# Patient Record
Sex: Male | Born: 1990 | Race: Black or African American | Hispanic: No | Marital: Married
Health system: Southern US, Community
[De-identification: ages and names within clinical notes are randomized; demographics above are authoritative.]

## PROBLEM LIST (undated history)

## (undated) DIAGNOSIS — I10 Essential (primary) hypertension: Secondary | ICD-10-CM

## (undated) DIAGNOSIS — G4730 Sleep apnea, unspecified: Secondary | ICD-10-CM

## (undated) DIAGNOSIS — J40 Bronchitis, not specified as acute or chronic: Secondary | ICD-10-CM

## (undated) DIAGNOSIS — R42 Dizziness and giddiness: Secondary | ICD-10-CM

## (undated) DIAGNOSIS — I509 Heart failure, unspecified: Secondary | ICD-10-CM

## (undated) HISTORY — PX: ABCESS DRAINAGE: SHX399

## (undated) HISTORY — PX: KNEE SURGERY: SHX244

## (undated) HISTORY — DX: Dizziness and giddiness: R42

## (undated) HISTORY — PX: TONSILLECTOMY: SUR1361

---
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
## 2012-04-25 ENCOUNTER — Emergency Department (HOSPITAL_COMMUNITY): Payer: Self-pay

## 2012-04-25 ENCOUNTER — Encounter (HOSPITAL_COMMUNITY): Payer: Self-pay | Admitting: Emergency Medicine

## 2012-04-25 ENCOUNTER — Emergency Department (HOSPITAL_COMMUNITY)
Admission: EM | Admit: 2012-04-25 | Discharge: 2012-04-25 | Disposition: A | Discharge status: Home / Self Care | Payer: Self-pay | Attending: Emergency Medicine | Admitting: Emergency Medicine

## 2012-04-25 DIAGNOSIS — M12819 Other specific arthropathies, not elsewhere classified, unspecified shoulder: Secondary | ICD-10-CM

## 2012-04-25 DIAGNOSIS — Z87891 Personal history of nicotine dependence: Secondary | ICD-10-CM | POA: Insufficient documentation

## 2012-04-25 DIAGNOSIS — X500XXA Overexertion from strenuous movement or load, initial encounter: Secondary | ICD-10-CM | POA: Insufficient documentation

## 2012-04-25 DIAGNOSIS — Z880 Allergy status to penicillin: Secondary | ICD-10-CM | POA: Insufficient documentation

## 2012-04-25 DIAGNOSIS — S43429A Sprain of unspecified rotator cuff capsule, initial encounter: Secondary | ICD-10-CM | POA: Insufficient documentation

## 2012-04-25 MED ORDER — KETOROLAC TROMETHAMINE 60 MG/2ML IM SOLN: 60.0000 mg | Freq: Once | INTRAMUSCULAR | Status: DC

## 2012-04-25 MED ORDER — TRAMADOL HCL 50 MG PO TABS: 50.0000 mg | ORAL_TABLET | Freq: Four times a day (QID) | ORAL | Status: AC | PRN

## 2012-04-25 NOTE — ED Notes (Signed)
Pt verbalizes understanding 

## 2012-04-25 NOTE — ED Notes (Signed)
Ortho at bedside.

## 2012-04-25 NOTE — ED Provider Notes (Signed)
History     CSN: [id number]  Arrival date & time 04/25/12  1508   First MD Initiated Contact with Patient 04/25/12 1531      Chief Complaint  Patient presents with  . Shoulder Pain    (Consider location/radiation/quality/duration/timing/severity/associated sxs/prior treatment) HPI   21 y.o. male in no acute distress complaining of exacerbation to left shoulder pain after reaching out left arm earlier today. Patient has history of rotator cuff tear occurring in March of 2013. He did not followup with the orthopedist for it. He has repetitive stress to both arms at his work. He is right-hand dominant. He denies any weakness, numbness or paresthesia. Range of motion is slightly reduced. Pain is severe at 8/10, located on the superior aspect of the left shoulder, exacerbated by movement and radiates to the neck.    History reviewed. No pertinent past medical history.  History reviewed. No pertinent past surgical history.  No family history on file.  History  Substance Use Topics  . Smoking status: Former Games developer  . Smokeless tobacco: Not on file  . Alcohol Use: No      Review of Systems  Musculoskeletal: Positive for arthralgias.  All other systems reviewed and are negative.    Allergies  Amoxicillin  Home Medications   Current Outpatient Rx  Name Route Sig Dispense Refill  . PROMETHAZINE HCL 6.25 MG/5ML PO SYRP Oral Take 6.25 mg by mouth 4 (four) times daily as needed. For nausea.    Marland Kitchen PRESCRIPTION MEDICATION Oral Take 1 capsule by mouth 2 (two) times daily. Antibiotic for bronchitis.      BP 135/64  Pulse 72  Temp 98.2 F (36.8 C) (Oral)  Resp 16  SpO2 98%  Physical Exam  Nursing note and vitals reviewed. Constitutional: He is oriented to person, place, and time. He appears well-developed and well-nourished. No distress.  HENT:  Head: Normocephalic.  Eyes: Conjunctivae and EOM are normal.  Neck: Normal range of motion. Neck supple.  Cardiovascular:  Normal rate.   Pulmonary/Chest: Effort normal.  Abdominal: Soft.  Musculoskeletal: Normal range of motion.        No deformities erythema or swelling noted to right shoulder. Patient has mildly reduced range of motion in abduction however drop arm test is negative. Patient's radial pulses are 2+ bilaterally and he also has normal sensation to soft touch and pinprick.  Neurological: He is alert and oriented to person, place, and time.  Psychiatric: He has a normal mood and affect.    ED Course  Procedures (including critical care time)  Labs Reviewed - No data to display No results found.   1. Rotator cuff arthropathy       MDM  21 year old male presenting with exacerbation of prior rotator cuff injury. There is no transection to the rotator cuff is drop arm is negative and he is able to lift the arm over 90. Advised patient that it is important to follow with orthopedist for possible physical therapy so this does not become a chronic issue. Patient voices understanding Patient is driving which limits pain medication options at the moment.  Pt verbalized understanding and agrees with care plan. Outpatient follow-up and return precautions given.           Wynetta Emery, PA-C 04/25/12 1626

## 2012-04-25 NOTE — ED Notes (Signed)
Pt presenting to ed with c/o left shoulder pain pt states he tore his rotator cuff in march/april and he didn't go to rehab and today he reached up and now he's having pain

## 2012-04-25 NOTE — ED Provider Notes (Signed)
Medical screening examination/treatment/procedure(s) were performed by non-physician practitioner and as supervising physician I was immediately available for consultation/collaboration.  Izekiel Flegel T Adenike Shidler, MD 04/25/12 2318 

## 2012-04-25 NOTE — Progress Notes (Signed)
Orthopedic Tech Progress Note Patient Details:  Dale Evans 1991/05/29 [id number]  Ortho Devices Type of Ortho Device: Arm foam sling Ortho Device/Splint Location: left arm Ortho Device/Splint Interventions: Application   Nikki Dom 04/25/2012, 4:50 PM

## 2012-05-27 ENCOUNTER — Emergency Department (HOSPITAL_COMMUNITY)
Admission: EM | Admit: 2012-05-27 | Discharge: 2012-05-27 | Disposition: A | Discharge status: Home / Self Care | Payer: 59 | Attending: Emergency Medicine | Admitting: Emergency Medicine

## 2012-05-27 ENCOUNTER — Emergency Department (HOSPITAL_COMMUNITY): Payer: 59

## 2012-05-27 ENCOUNTER — Encounter (HOSPITAL_COMMUNITY): Payer: Self-pay | Admitting: *Deleted

## 2012-05-27 DIAGNOSIS — Y9361 Activity, american tackle football: Secondary | ICD-10-CM | POA: Insufficient documentation

## 2012-05-27 DIAGNOSIS — X500XXA Overexertion from strenuous movement or load, initial encounter: Secondary | ICD-10-CM | POA: Insufficient documentation

## 2012-05-27 DIAGNOSIS — Y9239 Other specified sports and athletic area as the place of occurrence of the external cause: Secondary | ICD-10-CM | POA: Insufficient documentation

## 2012-05-27 DIAGNOSIS — S8391XA Sprain of unspecified site of right knee, initial encounter: Secondary | ICD-10-CM

## 2012-05-27 DIAGNOSIS — IMO0002 Reserved for concepts with insufficient information to code with codable children: Secondary | ICD-10-CM | POA: Insufficient documentation

## 2012-05-27 DIAGNOSIS — M79609 Pain in unspecified limb: Secondary | ICD-10-CM | POA: Insufficient documentation

## 2012-05-27 MED ORDER — NAPROXEN 500 MG PO TABS: 500.0000 mg | ORAL_TABLET | Freq: Two times a day (BID) | ORAL | Status: DC

## 2012-05-27 MED ORDER — HYDROCODONE-ACETAMINOPHEN 5-500 MG PO TABS: 1.0000 | ORAL_TABLET | Freq: Four times a day (QID) | ORAL | Status: DC | PRN

## 2012-05-27 MED ORDER — IBUPROFEN 800 MG PO TABS
800.0000 mg | ORAL_TABLET | Freq: Once | ORAL | Status: AC
  Administered 2012-05-27: 800 mg via ORAL
  Filled 2012-05-27: qty 1

## 2012-05-27 NOTE — ED Notes (Addendum)
Pt was playing football when injured kneed. Pt reports planting right foot, knee bending inward. Pt has hx of knee surgeries with same knee. Pt is unable to bear weight, has limited range of motion. Pt also c/o of right thumb pain from bracing fall

## 2012-05-27 NOTE — ED Notes (Signed)
Ortho tech at bedside 

## 2012-05-27 NOTE — ED Provider Notes (Signed)
History     CSN: [id number]  Arrival date & time 05/27/12  2023   First MD Initiated Contact with Patient 05/27/12 2230      Chief Complaint  Patient presents with  . Knee Injury    (Consider location/radiation/quality/duration/timing/severity/associated sxs/prior treatment) HPI Comments: Dale Evans is a 21 y.o. Male who presents with complaint of a right knee injury and right thumb injury. Pt states he was playing football when he planted his foot wrong and his knee hyperextended. Pt states it made him fall to the ground where he must have put his hand wrong and injured his right thumb. Pt states knee and thumb pain worsened with movement. He is unable to bear weight on right leg. Pt denies any other injuries. Hx of partial traumatic patella tendon tear to that leg surgically repaired. NO medications taken prior to the arrival.   History reviewed. No pertinent past medical history.  Past Surgical History  Procedure Date  . Knee surgery     History reviewed. No pertinent family history.  History  Substance Use Topics  . Smoking status: Former Games developer  . Smokeless tobacco: Never Used  . Alcohol Use: No      Review of Systems  Constitutional: Negative for chills.  Respiratory: Negative.   Cardiovascular: Negative.   Musculoskeletal: Positive for joint swelling and arthralgias.  Neurological: Negative for dizziness, weakness, numbness and headaches.    Allergies  Licorice flavor and Amoxicillin  Home Medications   Current Outpatient Rx  Name Route Sig Dispense Refill  . PRESCRIPTION MEDICATION Oral Take 1 capsule by mouth 2 (two) times daily. Antibiotic for bronchitis.    Marland Kitchen PROMETHAZINE HCL 6.25 MG/5ML PO SYRP Oral Take 6.25 mg by mouth 4 (four) times daily as needed. For nausea.      BP 128/53  Pulse 81  Temp 98.4 F (36.9 C) (Oral)  Resp 20  Ht 6\' 2"  (1.88 m)  Wt 285 lb (129.275 kg)  BMI 36.59 kg/m2  SpO2 97%  Physical Exam  Nursing note and vitals  reviewed. Constitutional: He is oriented to person, place, and time. He appears well-developed and well-nourished. No distress.  HENT:  Head: Normocephalic and atraumatic.  Eyes: Conjunctivae normal are normal.  Cardiovascular: Normal rate, regular rhythm and normal heart sounds.   Pulmonary/Chest: Effort normal and breath sounds normal. No respiratory distress. He has no wheezes. He has no rales.  Musculoskeletal: He exhibits edema and tenderness.       Right knee appears swollen. Tender over medial joint. Pain with any ROM. Joint is stable with negative anterior, posterior drawer signs. Pain but no laxity with medial and lateral stress. Patella intact. Right thumb swelling noted at the mcp joint. Tender to palpation over ulnar aspect. Pain with any rom. Dorsal pedal pulses normal.  Neurological: He is alert and oriented to person, place, and time.  Skin: Skin is warm and dry.  Psychiatric: He has a normal mood and affect.    ED Course  Procedures (including critical care time)  Labs Reviewed - No data to display Dg Knee Complete 4 Views Right  05/27/2012  *RADIOLOGY REPORT*  Clinical Data: Knee injury with pain  RIGHT KNEE - COMPLETE 4+ VIEW  Comparison: 01/24/2006  Findings: Negative for fracture.  Joint spaces are normal.  There is a joint effusion.  IMPRESSION: Joint effusion.  Negative for fracture.   Original Report Authenticated By: Camelia Phenes, M.D.    Dg Finger Thumb Right  05/27/2012  *RADIOLOGY  REPORT*  Clinical Data: Injury, pain.  RIGHT THUMB 2+V  Comparison: None.  Plain films 01/23/2007.  Findings: There is subtle cortical irregularity along the ulnar aspect of the base of the proximal phalanx of the thumb which could be due to nondisplaced fracture at the attachment site of the ulnar collateral ligament.  Imaged bones otherwise appear normal.  No dislocation.  IMPRESSION: Findings worrisome for nondisplaced avulsion fracture at the ulnar collateral ligament attachment site  to the proximal phalanx of the thumb.   Original Report Authenticated By: Bernadene Bell. D'ALESSIO, M.D.     PT's knee is swollen, but stable. X-ray negative. Patella intact. Neurovascularly intact. Do not think there is any vascular injury. Will place in an immobilizer, crutches, thumb spika, follow up with orthopedics.    1. Right knee sprain       MDM          Lottie Mussel, PA 05/28/12 [phone]

## 2012-05-29 NOTE — ED Provider Notes (Signed)
Medical screening examination/treatment/procedure(s) were performed by non-physician practitioner and as supervising physician I was immediately available for consultation/collaboration.  Marico Buckle, MD 05/29/12 2234 

## 2015-01-16 ENCOUNTER — Emergency Department (HOSPITAL_COMMUNITY)
Admission: EM | Admit: 2015-01-16 | Discharge: 2015-01-17 | Disposition: A | Discharge status: Home / Self Care | Payer: 59 | Attending: Emergency Medicine | Admitting: Emergency Medicine

## 2015-01-16 DIAGNOSIS — S6992XA Unspecified injury of left wrist, hand and finger(s), initial encounter: Secondary | ICD-10-CM | POA: Insufficient documentation

## 2015-01-16 DIAGNOSIS — Z87891 Personal history of nicotine dependence: Secondary | ICD-10-CM | POA: Insufficient documentation

## 2015-01-16 DIAGNOSIS — Y929 Unspecified place or not applicable: Secondary | ICD-10-CM | POA: Insufficient documentation

## 2015-01-16 DIAGNOSIS — X58XXXA Exposure to other specified factors, initial encounter: Secondary | ICD-10-CM | POA: Insufficient documentation

## 2015-01-16 DIAGNOSIS — Y998 Other external cause status: Secondary | ICD-10-CM | POA: Insufficient documentation

## 2015-01-16 DIAGNOSIS — Z791 Long term (current) use of non-steroidal anti-inflammatories (NSAID): Secondary | ICD-10-CM | POA: Insufficient documentation

## 2015-01-16 DIAGNOSIS — M79645 Pain in left finger(s): Secondary | ICD-10-CM

## 2015-01-16 DIAGNOSIS — Y9372 Activity, wrestling: Secondary | ICD-10-CM | POA: Insufficient documentation

## 2015-01-16 DIAGNOSIS — Z880 Allergy status to penicillin: Secondary | ICD-10-CM | POA: Insufficient documentation

## 2015-01-17 ENCOUNTER — Emergency Department (HOSPITAL_COMMUNITY): Payer: 59

## 2015-01-17 ENCOUNTER — Encounter (HOSPITAL_COMMUNITY): Payer: Self-pay | Admitting: Emergency Medicine

## 2015-01-17 MED ORDER — NAPROXEN 500 MG PO TABS
500.0000 mg | ORAL_TABLET | Freq: Once | ORAL | Status: AC
  Administered 2015-01-17: 500 mg via ORAL
  Filled 2015-01-17: qty 1

## 2015-01-17 MED ORDER — HYDROCODONE-ACETAMINOPHEN 5-325 MG PO TABS
1.0000 | ORAL_TABLET | Freq: Once | ORAL | Status: AC
  Administered 2015-01-17: 1 via ORAL
  Filled 2015-01-17: qty 1

## 2015-01-17 MED ORDER — HYDROCODONE-ACETAMINOPHEN 5-325 MG PO TABS: 1.0000 | ORAL_TABLET | ORAL | Status: DC | PRN

## 2015-01-17 MED ORDER — NAPROXEN 500 MG PO TABS: 500.0000 mg | ORAL_TABLET | Freq: Two times a day (BID) | ORAL | Status: DC

## 2015-01-17 NOTE — ED Notes (Signed)
Pt from home c/o left thumb pain with movement and swelling. Pt reports several days ago he was wrestling and injured his thumb but was then okay now with more pain and swelling.

## 2015-01-17 NOTE — ED Provider Notes (Signed)
CSN: [id number]     Arrival date & time 01/16/15  2323 History   First MD Initiated Contact with Patient 01/17/15 [phone]     Chief Complaint  Patient presents with  . thumb injury    (Consider location/radiation/quality/duration/timing/severity/associated sxs/prior Treatment) HPI  Dale Evans is a 24 year old male presenting with pain in his left thumb and wrist. He states he noticed the pain today around noon when he was lifting heavy boxes at work. He states the pain has become progressively worse throughout the day. He currently rates the pain as 7 out of 10. He denies any numbness, tingling, weakness in his hand.  History reviewed. No pertinent past medical history. Past Surgical History  Procedure Laterality Date  . Knee surgery     No family history on file. History  Substance Use Topics  . Smoking status: Former Games developermoker  . Smokeless tobacco: Never Used  . Alcohol Use: No    Review of Systems  Musculoskeletal: Positive for arthralgias. Negative for joint swelling.  Neurological: Negative for numbness.      Allergies  Licorice flavor and Amoxicillin  Home Medications   Prior to Admission medications   Medication Sig Start Date End Date Taking? Authorizing Provider  HYDROcodone-acetaminophen (VICODIN) 5-500 MG per tablet Take 1-2 tablets by mouth every 6 (six) hours as needed for pain. 05/27/12   Tatyana Kirichenko, PA-C  naproxen (NAPROSYN) 500 MG tablet Take 1 tablet (500 mg total) by mouth 2 (two) times daily. 05/27/12   Tatyana Kirichenko, PA-C   BP 137/65 mmHg  Pulse 54  Temp(Src) 98.4 F (36.9 C) (Oral)  Resp 16  Ht 6\' 2"  (1.88 m)  Wt 305 lb (138.347 kg)  BMI 39.14 kg/m2  SpO2 100% Physical Exam  Constitutional: He appears well-developed and well-nourished. No distress.  HENT:  Head: Normocephalic and atraumatic.  Eyes: Conjunctivae are normal. Right eye exhibits no discharge. Left eye exhibits no discharge. No scleral icterus.  Cardiovascular: Intact  distal pulses.   Pulmonary/Chest: Effort normal.  Musculoskeletal: He exhibits tenderness.       Hands: TTP to the left 1st metacarpal, no scaphoid tenderness, no redness or swelling noted.     Neurological: He is alert. Coordination normal.  Skin: He is not diaphoretic.  Nursing note and vitals reviewed.   ED Course  Procedures (including critical care time) Labs Review Labs Reviewed - No data to display  Imaging Review Dg Hand Complete Left  01/17/2015   CLINICAL DATA:  Wrestling injury several days ago. Now with increasing pain and swelling.  EXAM: LEFT HAND - COMPLETE 3+ VIEW  COMPARISON:  None.  FINDINGS: There is no evidence of fracture or dislocation. There is no evidence of arthropathy or other focal bone abnormality. Soft tissues are unremarkable.  IMPRESSION: Negative.   Electronically Signed   By: Ellery Plunkaniel R Mitchell M.D.   On: 01/17/2015 01:34     EKG Interpretation None      MDM   Final diagnoses:  Thumb pain, left   24 yo with thumb pain after heavy lifting. His x-ray is negative for obvious fracture or dislocation. His pain was managed in ED. Splint given in the ED. Pt advised to follow up with hand referral if symptoms persist. Conservative therapy recommended and discussed. Pt is well-appearing, in no acute distress and vital signs are stable.  They appear safe to be discharged.  Discharge include follow-up with their PCP.  Return precautions provided.    Filed Vitals:   01/17/15 0000 01/17/15  0253  BP: 137/65 139/78  Pulse: 54 60  Temp: 98.4 F (36.9 C) 98.4 F (36.9 C)  TempSrc: Oral Oral  Resp: 16 18  Height: 6\' 2"  (1.88 m)   Weight: 305 lb (138.347 kg)   SpO2: 100% 100%   Meds given in ED:  Medications  HYDROcodone-acetaminophen (NORCO/VICODIN) 5-325 MG per tablet 1 tablet (1 tablet Oral Given 01/17/15 0248)  naproxen (NAPROSYN) tablet 500 mg (500 mg Oral Given 01/17/15 0248)    Discharge Medication List as of 01/17/2015  2:49 AM    START taking  these medications   Details  HYDROcodone-acetaminophen (NORCO/VICODIN) 5-325 MG per tablet Take 1 tablet by mouth every 4 (four) hours as needed., Starting 01/17/2015, Until Discontinued, Print         01/17/15 0000  HYDROcodone-acetaminophen (NORCO/VICODIN) 5-325 MG per tablet Every 4 hours PRN Discontinue Reprint 01/17/15 0246   01/17/15 0000  naproxen (NAPROSYN) 500 MG tablet 2 times daily Discontinue Reprint 01/17/15 0246        Harle BattiestElizabeth Corneisha Alvi, NP 01/18/15 1708  Mirian MoMatthew Gentry, MD 01/20/15 [phone]

## 2015-01-17 NOTE — Discharge Instructions (Signed)
Please follow the directions provided. Be sure to follow-up with the Hand doctor if your pain does not improve.  You may use naproxen twice a day to help with pain and inflammation. You may use the vicodin for pain not relieved by the naproxen.  Use ice 20 minutes on 20 minutes off to help with discomfort. Don't hesitate to return for any new, worsening, or concerning symptoms.   SEEK IMMEDIATE MEDICAL CARE IF:  Your thumb or fingers change color, become painful, or there is numbness or tingling in your thumb or fingers. Your cast or splint may be too tight.

## 2015-06-14 ENCOUNTER — Encounter (HOSPITAL_COMMUNITY): Payer: Self-pay | Admitting: Emergency Medicine

## 2015-06-14 ENCOUNTER — Emergency Department (HOSPITAL_COMMUNITY)
Admission: EM | Admit: 2015-06-14 | Discharge: 2015-06-14 | Disposition: A | Discharge status: Home / Self Care | Payer: 59 | Source: Home / Self Care | Attending: Family Medicine | Admitting: Family Medicine

## 2015-06-14 DIAGNOSIS — J029 Acute pharyngitis, unspecified: Secondary | ICD-10-CM

## 2015-06-14 DIAGNOSIS — R0982 Postnasal drip: Secondary | ICD-10-CM

## 2015-06-14 NOTE — ED Notes (Signed)
Sore throat or burning, generalized malaise

## 2015-06-14 NOTE — Discharge Instructions (Signed)
Sore Throat Cepacol lozenges May use Allegra or Zyrtec daily as needed for drainage. A sore throat is pain, burning, irritation, or scratchiness of the throat. There is often pain or tenderness when swallowing or talking. A sore throat may be accompanied by other symptoms, such as coughing, sneezing, fever, and swollen neck glands. A sore throat is often the first sign of another sickness, such as a cold, flu, strep throat, or mononucleosis (commonly known as mono). Most sore throats go away without medical treatment. CAUSES  The most common causes of a sore throat include:  A viral infection, such as a cold, flu, or mono.  A bacterial infection, such as strep throat, tonsillitis, or whooping cough.  Seasonal allergies.  Dryness in the air.  Irritants, such as smoke or pollution.  Gastroesophageal reflux disease (GERD). HOME CARE INSTRUCTIONS   Only take over-the-counter medicines as directed by your caregiver.  Drink enough fluids to keep your urine clear or pale yellow.  Rest as needed.  Try using throat sprays, lozenges, or sucking on hard candy to ease any pain (if older than 4 years or as directed).  Sip warm liquids, such as broth, herbal tea, or warm water with honey to relieve pain temporarily. You may also eat or drink cold or frozen liquids such as frozen ice pops.  Gargle with salt water (mix 1 tsp salt with 8 oz of water).  Do not smoke and avoid secondhand smoke.  Put a cool-mist humidifier in your bedroom at night to moisten the air. You can also turn on a hot shower and sit in the bathroom with the door closed for 5-10 minutes. SEEK IMMEDIATE MEDICAL CARE IF:  You have difficulty breathing.  You are unable to swallow fluids, soft foods, or your saliva.  You have increased swelling in the throat.  Your sore throat does not get better in 7 days.  You have nausea and vomiting.  You have a fever or persistent symptoms for more than 2-3 days.  You have a  fever and your symptoms suddenly get worse. MAKE SURE YOU:   Understand these instructions.  Will watch your condition.  Will get help right away if you are not doing well or get worse. Document Released: 10/05/2004 Document Revised: 08/14/2012 Document Reviewed: 05/05/2012 [facility] Patient Information 2015 [place], [place]. This information is not intended to replace advice given to you by your health care provider. Make sure you discuss any questions you have with your health care provider.

## 2015-06-14 NOTE — ED Provider Notes (Signed)
CSN: [id number]     [id number] date & time 06/14/15  1906 History   First MD Initiated Contact with Patient 06/14/15 2044     No chief complaint on file.  (Consider location/radiation/quality/duration/timing/severity/associated sxs/prior Treatment) HPI Comments: 24 year old male complaining of a sore scratchy throat for 2 days. Is also been feeling groggy. Describes a sore throat is scratchy and burning associated with PND. Denies fever. Denies earache, cough or shortness of breath.   No past medical history on file. Past Surgical History  Procedure Laterality Date  . Knee surgery     No family history on file. Social History  Substance Use Topics  . Smoking status: Former Games developer  . Smokeless tobacco: Never Used  . Alcohol Use: No    Review of Systems  Constitutional: Positive for activity change. Negative for fever.  HENT: Positive for postnasal drip and sore throat. Negative for ear discharge, ear pain, hearing loss and sinus pressure.   Eyes: Negative.   Respiratory: Negative for cough and shortness of breath.   Cardiovascular: Negative for chest pain.  Gastrointestinal: Negative.     Allergies  Licorice flavor and Amoxicillin  Home Medications   Prior to Admission medications   Medication Sig Start Date End Date Taking? Authorizing Provider  HYDROcodone-acetaminophen (NORCO/VICODIN) 5-325 MG per tablet Take 1 tablet by mouth every 4 (four) hours as needed. 01/17/15   [name], NP  naproxen (NAPROSYN) 500 MG tablet Take 1 tablet (500 mg total) by mouth 2 (two) times daily. 01/17/15   [name], NP   All: Without RV and more RV is a severe O is a   Medications - No data to display  BP 132/78 mmHg  Pulse 55  Temp(Src) 98.2 F (36.8 C) (Oral)  Resp 16  SpO2 97% No data found.   Physical Exam  Constitutional: He is oriented to person, place, and time. He appears well-developed and well-nourished. No distress.  HENT:  Bilateral TMs are  normal Oropharynx with minor erythema and clear PND. No exudates  Eyes: EOM are normal.  Neck: Normal range of motion. Neck supple.  Cardiovascular: Normal rate and normal heart sounds.   Pulmonary/Chest: Effort normal. No respiratory distress. He has no wheezes.  Musculoskeletal: He exhibits no edema.  Lymphadenopathy:    He has cervical adenopathy.  Neurological: He is alert and oriented to person, place, and time. He exhibits normal muscle tone.  Skin: Skin is warm and dry.  Psychiatric: He has a normal mood and affect.  Nursing note and vitals reviewed.   ED Course  Procedures (including critical care time)  Labs Review Labs Reviewed - No data to display  Imaging Review No results found.   Visual Acuity Review  Right Eye Distance:   Left Eye Distance:   Bilateral Distance:    Right Eye Near:   Left Eye Near:    Bilateral Near:         MDM   1. PND (post-nasal drip)   2. Allergic pharyngitis    Cepacol lozenges May use Allegra or Zyrtec daily as needed for drainage.     [name], NP 06/14/15 [phone]

## 2015-11-26 ENCOUNTER — Encounter (HOSPITAL_COMMUNITY): Payer: Self-pay | Admitting: Emergency Medicine

## 2015-11-26 ENCOUNTER — Emergency Department (HOSPITAL_COMMUNITY)
Admission: EM | Admit: 2015-11-26 | Discharge: 2015-11-26 | Disposition: A | Discharge status: Home / Self Care | Payer: 59 | Source: Home / Self Care | Attending: Family Medicine | Admitting: Family Medicine

## 2015-11-26 DIAGNOSIS — S46112A Strain of muscle, fascia and tendon of long head of biceps, left arm, initial encounter: Secondary | ICD-10-CM | POA: Diagnosis not present

## 2015-11-26 DIAGNOSIS — S46212A Strain of muscle, fascia and tendon of other parts of biceps, left arm, initial encounter: Secondary | ICD-10-CM

## 2015-11-26 NOTE — ED Provider Notes (Signed)
CSN: [id number]     Arrival date & time 11/26/15  1506 History   First MD Initiated Contact with Patient 11/26/15 1709     Chief Complaint  Patient presents with  . Arm Pain   (Consider location/radiation/quality/duration/timing/severity/associated sxs/prior Treatment) HPI History obtained from patient:   LOCATION:left bicep SEVERITY:5 DURATION:2weeks but worse the last 24 hours CONTEXT:initial injury while wrestling. Since that time continued pain. Trouble lifting boxes at work. Denies muscle bulging or popping sensation in arm.  QUALITY: MODIFYING FACTORS:OTC symptoms and rest ASSOCIATED SYMPTOMS:none TIMING:now contant OCCUPATION:  History reviewed. No pertinent past medical history. Past Surgical History  Procedure Laterality Date  . Knee surgery     No family history on file. Social History  Substance Use Topics  . Smoking status: Former Games developermoker  . Smokeless tobacco: Never Used  . Alcohol Use: No    Review of Systems  Allergies  Licorice flavor and Amoxicillin  Home Medications   Prior to Admission medications   Medication Sig Start Date End Date Taking? Authorizing Provider  HYDROcodone-acetaminophen (NORCO/VICODIN) 5-325 MG per tablet Take 1 tablet by mouth every 4 (four) hours as needed. 01/17/15   Harle BattiestElizabeth Tysinger, NP  naproxen (NAPROSYN) 500 MG tablet Take 1 tablet (500 mg total) by mouth 2 (two) times daily. 01/17/15   Harle BattiestElizabeth Tysinger, NP   Meds Ordered and Administered this Visit  Medications - No data to display  BP 138/66 mmHg  Pulse 56  Temp(Src) 98.3 F (36.8 C) (Oral)  Resp 20  SpO2 100% No data found.   Physical Exam  Constitutional: He is oriented to person, place, and time. He appears well-developed and well-nourished. No distress.  HENT:  Head: Normocephalic and atraumatic.  Pulmonary/Chest: Effort normal.  Musculoskeletal: He exhibits tenderness.       Arms: Neurological: He is alert and oriented to person, place, and time.    Skin: Skin is warm and dry.  Psychiatric: He has a normal mood and affect. His behavior is normal.    ED Course  Procedures (including critical care time)  Labs Review Labs Reviewed - No data to display  Imaging Review No results found.   Visual Acuity Review  Right Eye Distance:   Left Eye Distance:   Bilateral Distance:    Right Eye Near:   Left Eye Near:    Bilateral Near:         MDM   1. Biceps muscle strain, left, initial encounter     Patient is reassured that there are no issues that require transfer to higher level of care at this time or additional tests. Patient is advised to continue home symptomatic treatment. Patient is advised that if there are new or worsening symptoms to attend the emergency department, contact primary care provider, or return to UC. Instructions of care provided discharged home in stable condition. Return to work/school note provided.   THIS NOTE WAS GENERATED USING A VOICE RECOGNITION SOFTWARE PROGRAM. ALL REASONABLE EFFORTS  WERE MADE TO PROOFREAD THIS DOCUMENT FOR ACCURACY.  I have verbally reviewed the discharge instructions with the patient. A printed AVS was given to the patient.  All questions were answered prior to discharge.      Tharon AquasFrank C Cherilyn Sautter, PA 11/26/15 1735

## 2015-11-26 NOTE — Discharge Instructions (Signed)
Contact orthopedics Monday to arrange follow up.   Wear sling  Tylenol and or ibuprofen for pain  Apply moist heat compresses to the arm.

## 2015-11-26 NOTE — ED Notes (Signed)
C/o left arm pain onset x2 weeks... Reports he possibly inj arm while wrestling or at work where he lifts heavy objects Pain increases w/activity A&O x4... No acute distress

## 2015-12-13 ENCOUNTER — Other Ambulatory Visit: Payer: Self-pay | Admitting: Orthopedic Surgery

## 2015-12-13 DIAGNOSIS — M25522 Pain in left elbow: Secondary | ICD-10-CM

## 2015-12-21 ENCOUNTER — Ambulatory Visit
Admission: RE | Admit: 2015-12-21 | Discharge: 2015-12-21 | Disposition: A | Discharge status: Home / Self Care | Payer: 59 | Source: Ambulatory Visit | Attending: Orthopedic Surgery | Admitting: Orthopedic Surgery

## 2015-12-21 DIAGNOSIS — M25522 Pain in left elbow: Secondary | ICD-10-CM

## 2016-02-09 ENCOUNTER — Ambulatory Visit (HOSPITAL_COMMUNITY)
Admission: EM | Admit: 2016-02-09 | Discharge: 2016-02-09 | Disposition: A | Discharge status: Home / Self Care | Payer: 59 | Attending: Emergency Medicine | Admitting: Emergency Medicine

## 2016-02-09 ENCOUNTER — Encounter (HOSPITAL_COMMUNITY): Payer: Self-pay | Admitting: *Deleted

## 2016-02-09 DIAGNOSIS — T700XXA Otitic barotrauma, initial encounter: Secondary | ICD-10-CM | POA: Diagnosis not present

## 2016-02-09 DIAGNOSIS — J302 Other seasonal allergic rhinitis: Secondary | ICD-10-CM

## 2016-02-09 DIAGNOSIS — H6983 Other specified disorders of Eustachian tube, bilateral: Secondary | ICD-10-CM | POA: Diagnosis not present

## 2016-02-09 MED ORDER — PREDNISONE 20 MG PO TABS: ORAL_TABLET | ORAL | Status: DC

## 2016-02-09 NOTE — Discharge Instructions (Signed)
Allergic Rhinitis For nasal and head congestion may take Sudafed PE 10 mg every 4 hours as needed. Saline nasal spray used frequently. For drainage may use Allegra, Claritin or Zyrtec. If you need stronger medicine to stop drainage may take Chlor-Trimeton 2-4 mg every 4 hours. This may cause drowsiness. Ibuprofen 600 mg every 6 hours as needed for pain, discomfort or fever. Drink plenty of fluids and stay well-hydrated. Rhinocort or Flonase nasal spray daily for the next 3-4 weeks.  Allergic rhinitis is when the mucous membranes in the nose respond to allergens. Allergens are particles in the air that cause your body to have an allergic reaction. This causes you to release allergic antibodies. Through a chain of events, these eventually cause you to release histamine into the blood stream. Although meant to protect the body, it is this release of histamine that causes your discomfort, such as frequent sneezing, congestion, and an itchy, runny nose.  CAUSES Seasonal allergic rhinitis (hay fever) is caused by pollen allergens that may come from grasses, trees, and weeds. Year-round allergic rhinitis (perennial allergic rhinitis) is caused by allergens such as house dust mites, pet dander, and mold spores. SYMPTOMS  Nasal stuffiness (congestion).  Itchy, runny nose with sneezing and tearing of the eyes. DIAGNOSIS Your health care provider can help you determine the allergen or allergens that trigger your symptoms. If you and your health care provider are unable to determine the allergen, skin or blood testing may be used. Your health care provider will diagnose your condition after taking your health history and performing a physical exam. Your health care provider may assess you for other related conditions, such as asthma, pink eye, or an ear infection. TREATMENT Allergic rhinitis does not have a cure, but it can be controlled by:  Medicines that block allergy symptoms. These may include allergy  shots, nasal sprays, and oral antihistamines.  Avoiding the allergen. Hay fever may often be treated with antihistamines in pill or nasal spray forms. Antihistamines block the effects of histamine. There are over-the-counter medicines that may help with nasal congestion and swelling around the eyes. Check with your health care provider before taking or giving this medicine. If avoiding the allergen or the medicine prescribed do not work, there are many new medicines your health care provider can prescribe. Stronger medicine may be used if initial measures are ineffective. Desensitizing injections can be used if medicine and avoidance does not work. Desensitization is when a patient is given ongoing shots until the body becomes less sensitive to the allergen. Make sure you follow up with your health care provider if problems continue. HOME CARE INSTRUCTIONS It is not possible to completely avoid allergens, but you can reduce your symptoms by taking steps to limit your exposure to them. It helps to know exactly what you are allergic to so that you can avoid your specific triggers. SEEK MEDICAL CARE IF:  You have a fever.  You develop a cough that does not stop easily (persistent).  You have shortness of breath.  You start wheezing.  Symptoms interfere with normal daily activities.   This information is not intended to replace advice given to you by your health care provider. Make sure you discuss any questions you have with your health care provider.   Document Released: 05/23/2001 Document Revised: 09/18/2014 Document Reviewed: 05/05/2013 Elsevier Interactive Patient Education [employer]2016 Elsevier Inc.

## 2016-02-09 NOTE — ED Provider Notes (Signed)
CSN: [id number]     Arrival date & time 02/09/16  1835 History   First MD Initiated Contact with Patient 02/09/16 1945     Chief Complaint  Patient presents with  . Cough   (Consider location/radiation/quality/duration/timing/severity/associated sxs/prior Treatment) HPI Comments: 25 year old male complaining of PND, cough, hoarseness, sore throat and ear discomfort for the past 2 days. Denies shortness of breath or fever. Denies history of asthma or smoking. He has been seeing small speck of blood tinge nasal discharge.   History reviewed. No pertinent past medical history. Past Surgical History  Procedure Laterality Date  . Knee surgery     History reviewed. No pertinent family history. Social History  Substance Use Topics  . Smoking status: Former Games developer  . Smokeless tobacco: Never Used  . Alcohol Use: No    Review of Systems  Constitutional: Positive for activity change. Negative for fever, diaphoresis and fatigue.  HENT: Positive for congestion, postnasal drip, rhinorrhea, sore throat, trouble swallowing and voice change. Negative for ear pain and facial swelling.   Eyes: Negative for pain, discharge and redness.  Respiratory: Positive for cough. Negative for chest tightness and shortness of breath.   Cardiovascular: Negative.   Gastrointestinal: Negative.   Musculoskeletal: Negative.  Negative for neck pain and neck stiffness.  Neurological: Negative.   All other systems reviewed and are negative.   Allergies  Licorice flavor and Amoxicillin  Home Medications   Prior to Admission medications   Medication Sig Start Date End Date Taking? Authorizing Provider  predniSONE (DELTASONE) 20 MG tablet 3 Tabs PO Days 1-3, then 2 tabs PO Days 4-6, then 1 tab PO Day 7-9, then Half Tab PO Day 10-12 02/09/16   [name], NP   Meds Ordered and Administered this Visit  Medications - No data to display  BP 143/70 mmHg  Pulse 60  Temp(Src) 98.9 F (37.2 C) (Oral)  Resp 18   SpO2 100% No data found.   Physical Exam  Constitutional: He is oriented to person, place, and time. He appears well-developed and well-nourished. No distress.  HENT:  Head: Normocephalic and atraumatic.  Mouth/Throat: No oropharyngeal exudate.  Bilateral TMs retracted. Minor erythema. No effusion.  Oropharynx with mild erythema, streaky cobblestoning. No exudates.  Eyes: Conjunctivae and EOM are normal.  Neck: Normal range of motion. Neck supple.  Cardiovascular: Normal rate, regular rhythm and normal heart sounds.   Pulmonary/Chest: Effort normal and breath sounds normal. No respiratory distress. He has no wheezes. He has no rales.  Musculoskeletal: Normal range of motion. He exhibits no edema.  Lymphadenopathy:    He has no cervical adenopathy.  Neurological: He is alert and oriented to person, place, and time.  Skin: Skin is warm and dry. No rash noted.  Psychiatric: He has a normal mood and affect.  Nursing note and vitals reviewed.   ED Course  Procedures (including critical care time)  Labs Review Labs Reviewed - No data to display  Imaging Review No results found.   Visual Acuity Review  Right Eye Distance:   Left Eye Distance:   Bilateral Distance:    Right Eye Near:   Left Eye Near:    Bilateral Near:         MDM   1. Other seasonal allergic rhinitis   2. Barotitis media, initial encounter   3. ETD (eustachian tube dysfunction), bilateral    Allergic Rhinitis For nasal and head congestion may take Sudafed PE 10 mg every 4 hours as needed. Saline nasal  spray used frequently. For drainage may use Allegra, Claritin or Zyrtec. If you need stronger medicine to stop drainage may take Chlor-Trimeton 2-4 mg every 4 hours. This may cause drowsiness. Ibuprofen 600 mg every 6 hours as needed for pain, discomfort or fever. Drink plenty of fluids and stay well-hydrated. Rhinocort or Flonase nasal spray daily for the next 3-4 weeks. Meds ordered this  encounter  Medications  . predniSONE (DELTASONE) 20 MG tablet    Sig: 3 Tabs PO Days 1-3, then 2 tabs PO Days 4-6, then 1 tab PO Day 7-9, then Half Tab PO Day 10-12    Dispense:  20 tablet    Refill:  0    Order Specific Question:  Supervising Provider    Answer:  Micheline ChapmanHONIG, ERIN J [[id number]]       Hayden Rasmussenavid Orvile Corona, NP 02/09/16 2012

## 2016-02-09 NOTE — ED Notes (Signed)
Pt  Reports  Symptoms  Of  A  Cough   With  Blood  Tinged   Sputum  As   Well  As     Hoarseness    With  Symptoms  X  2  Days    Pt  Reports    Symptoms  Not  releived  By otc  meds

## 2016-04-25 ENCOUNTER — Encounter (HOSPITAL_COMMUNITY): Payer: Self-pay | Admitting: Emergency Medicine

## 2016-04-25 ENCOUNTER — Ambulatory Visit (HOSPITAL_COMMUNITY)
Admission: EM | Admit: 2016-04-25 | Discharge: 2016-04-25 | Disposition: A | Discharge status: Home / Self Care | Payer: 59 | Attending: Family Medicine | Admitting: Family Medicine

## 2016-04-25 DIAGNOSIS — R110 Nausea: Secondary | ICD-10-CM

## 2016-04-25 DIAGNOSIS — J069 Acute upper respiratory infection, unspecified: Secondary | ICD-10-CM | POA: Diagnosis not present

## 2016-04-25 MED ORDER — ONDANSETRON HCL 4 MG PO TABS: 4.0000 mg | ORAL_TABLET | Freq: Three times a day (TID) | ORAL | 0 refills | Status: DC | PRN

## 2016-04-25 MED ORDER — BENZONATATE 100 MG PO CAPS: 100.0000 mg | ORAL_CAPSULE | Freq: Three times a day (TID) | ORAL | 0 refills | Status: DC | PRN

## 2016-04-25 MED ORDER — HYDROCOD POLST-CPM POLST ER 10-8 MG/5ML PO SUER: 5.0000 mL | Freq: Every evening | ORAL | 0 refills | Status: DC | PRN

## 2016-04-25 NOTE — Discharge Instructions (Signed)
Start Benzonatate for cough three times a day as needed. May take Tussionex at night for cough. Use Zofran every 8 hours as needed for nausea. Encouraged to rest, increase fluid intake to thin mucus and may take Ibuprofen 600mg  every 6 to 8 hours as needed for pain. Follow-up with a primary care provider in 3 to 4 days if not improving.

## 2016-04-25 NOTE — ED Triage Notes (Signed)
[facility] a complaint of general body aches and chest congestion that started yesterday. The patient did report 1 incident of N/V that happened this am.

## 2016-04-25 NOTE — ED Provider Notes (Signed)
CSN: [id number]     Arrival date & time 04/25/16  1025 History   First MD Initiated Contact with Patient 04/25/16 1137     Chief Complaint  Patient presents with  . Nausea   (Consider location/radiation/quality/duration/timing/severity/associated sxs/prior Treatment) 25 year old male presents with nasal congestion, cough/chest congestion, body aches and fever since yesterday. Also having a headache and vomited this AM due to mucus and nausea. Took Sudafed yesterday which provided minimal help for symptoms. Has not been able to sleep due to cough. Has been able to keep down Gatorade since vomiting this morning. No other family members ill.    The history is provided by the patient.  URI  Presenting symptoms: congestion, cough, fatigue, fever, rhinorrhea and sore throat   Presenting symptoms: no ear pain   Congestion:    Location:  Nasal and chest   Interferes with sleep: yes     Interferes with eating/drinking: yes   Cough:    Cough characteristics:  Productive   Sputum characteristics:  Yellow   Severity:  Moderate   Onset quality:  Sudden   Duration:  2 days   Timing:  Constant   Progression:  Worsening   Chronicity:  New Associated symptoms: headaches and myalgias     History reviewed. No pertinent past medical history. Past Surgical History:  Procedure Laterality Date  . KNEE SURGERY     History reviewed. No pertinent family history. Social History  Substance Use Topics  . Smoking status: Former Games developermoker  . Smokeless tobacco: Never Used  . Alcohol use No    Review of Systems  Constitutional: Positive for fatigue and fever.  HENT: Positive for congestion, rhinorrhea, sinus pressure and sore throat. Negative for ear pain.   Respiratory: Positive for cough. Negative for shortness of breath.   Gastrointestinal: Positive for vomiting. Negative for nausea.  Musculoskeletal: Positive for myalgias.  Neurological: Positive for headaches.    Allergies  Licorice flavor  [artificial licorice flavor] and Amoxicillin  Home Medications   Prior to Admission medications   Medication Sig Start Date End Date Taking? Authorizing Provider  benzonatate (TESSALON) 100 MG capsule Take 1 capsule (100 mg total) by mouth 3 (three) times daily as needed for cough. 04/25/16   Sudie GrumblingAnn Berry Haeley Fordham, NP  chlorpheniramine-HYDROcodone [facility](TUSSIONEX PENNKINETIC ER) 10-8 MG/5ML SUER Take 5 mLs by mouth at bedtime as needed for cough. 04/25/16   Sudie GrumblingAnn Berry Jahi Roza, NP  ondansetron (ZOFRAN) 4 MG tablet Take 1 tablet (4 mg total) by mouth every 8 (eight) hours as needed for nausea or vomiting. 04/25/16   Sudie GrumblingAnn Berry Rinaldo Macqueen, NP   Meds Ordered and Administered this Visit  Medications - No data to display  BP 159/64 (BP Location: Left Arm)   Pulse 81   Temp 99.6 F (37.6 C) (Oral)   Resp 20   SpO2 100%  No data found.   Physical Exam  Constitutional: He is oriented to person, place, and time. He appears well-developed and well-nourished. He appears ill.  HENT:  Head: Normocephalic and atraumatic.  Right Ear: Hearing, tympanic membrane, external ear and ear canal normal.  Left Ear: Hearing, tympanic membrane, external ear and ear canal normal.  Nose: Rhinorrhea present. Right sinus exhibits frontal sinus tenderness. Right sinus exhibits no maxillary sinus tenderness. Left sinus exhibits frontal sinus tenderness. Left sinus exhibits no maxillary sinus tenderness.  Mouth/Throat: Uvula is midline and mucous membranes are normal. Posterior oropharyngeal erythema present.  Neck: Normal range of motion. Neck supple.  Cardiovascular: Normal  rate, regular rhythm and normal heart sounds.   Pulmonary/Chest: Effort normal and breath sounds normal. He has no decreased breath sounds. He has no wheezes. He has no rhonchi. He has no rales.  Lymphadenopathy:    He has no cervical adenopathy.  Neurological: He is alert and oriented to person, place, and time.  Skin: Skin is warm and dry.  Psychiatric: He has a  normal mood and affect. His behavior is normal. Judgment and thought content normal.    Urgent Care Course   Clinical Course    Procedures (including critical care time)  Labs Review Labs Reviewed - No data to display  Imaging Review No results found.   Visual Acuity Review  Right Eye Distance:   Left Eye Distance:   Bilateral Distance:    Right Eye Near:   Left Eye Near:    Bilateral Near:         MDM   1. URI (upper respiratory infection)   2. Nausea    Discussed with patient that symptoms and clinical findings are most likely due to a viral illness. Recommend Benzonatate 3 times a day as needed for cough. May take Tussionex 1 tsp at night to help with sleep. May take Zofran 4mg  every 8 hours as needed for nausea. Rest. Note written to be out of work today and tomorrow. Increase fluids. May continue Sudafed as needed for congestion. Follow-up with a primary care provider in 4 to 5 days if not improving.   Recommend Benzonatate 100mg  every 8 hours as needed for cough.     Sudie GrumblingAnn Berry Terrian Ridlon, NP 04/26/16 [phone]

## 2016-05-09 ENCOUNTER — Encounter (HOSPITAL_COMMUNITY): Payer: Self-pay | Admitting: Emergency Medicine

## 2016-05-09 ENCOUNTER — Ambulatory Visit (HOSPITAL_COMMUNITY)
Admission: EM | Admit: 2016-05-09 | Discharge: 2016-05-09 | Disposition: A | Discharge status: Home / Self Care | Payer: 59 | Attending: Family Medicine | Admitting: Family Medicine

## 2016-05-09 DIAGNOSIS — J0100 Acute maxillary sinusitis, unspecified: Secondary | ICD-10-CM

## 2016-05-09 DIAGNOSIS — J40 Bronchitis, not specified as acute or chronic: Secondary | ICD-10-CM

## 2016-05-09 MED ORDER — GUAIFENESIN-CODEINE 100-10 MG/5ML PO SYRP: 5.0000 mL | ORAL_SOLUTION | Freq: Three times a day (TID) | ORAL | 0 refills | Status: DC | PRN

## 2016-05-09 MED ORDER — LEVOFLOXACIN 500 MG PO TABS: 500.0000 mg | ORAL_TABLET | Freq: Every day | ORAL | 0 refills | Status: DC

## 2016-05-09 MED ORDER — METHYLPREDNISOLONE 4 MG PO TBPK: ORAL_TABLET | ORAL | 0 refills | Status: DC

## 2016-05-09 NOTE — ED Provider Notes (Signed)
CSN: [id number]     Arrival date & time 05/09/16  1706 History   First MD Initiated Contact with Patient 05/09/16 1906     Chief Complaint  Patient presents with  . Cough   (Consider location/radiation/quality/duration/timing/severity/associated sxs/prior Treatment) Patient c/o cough, head congestion, fever, sore throat, face pain, and fatigue for over a week.   The history is provided by the patient.  Cough  Cough characteristics:  Productive Sputum characteristics:  Manson PasseyBrown Severity:  Moderate Onset quality:  Gradual Duration:  1 week Timing:  Constant Progression:  Worsening Chronicity:  New Smoker: no   Relieved by:  Nothing Worsened by:  Nothing Associated symptoms: sore throat     History reviewed. No pertinent past medical history. Past Surgical History:  Procedure Laterality Date  . KNEE SURGERY     History reviewed. No pertinent family history. Social History  Substance Use Topics  . Smoking status: Former Games developermoker  . Smokeless tobacco: Never Used  . Alcohol use No    Review of Systems  Constitutional: Negative.   HENT: Positive for sinus pressure and sore throat.   Eyes: Negative.   Respiratory: Positive for cough.   Gastrointestinal: Negative.   Endocrine: Negative.   Genitourinary: Negative.   Musculoskeletal: Negative.   Skin: Negative.   Allergic/Immunologic: Negative.   Neurological: Negative.   Hematological: Negative.   Psychiatric/Behavioral: Negative.     Allergies  Licorice flavor [artificial licorice flavor] and Amoxicillin  Home Medications   Prior to Admission medications   Medication Sig Start Date End Date Taking? Authorizing Provider  benzonatate (TESSALON) 100 MG capsule Take 1 capsule (100 mg total) by mouth 3 (three) times daily as needed for cough. 04/25/16   Sudie GrumblingAnn Berry Amyot, NP  chlorpheniramine-HYDROcodone [facility](TUSSIONEX PENNKINETIC ER) 10-8 MG/5ML SUER Take 5 mLs by mouth at bedtime as needed for cough. 04/25/16   Sudie GrumblingAnn Berry Amyot,  NP  guaiFENesin-codeine (CHERATUSSIN AC) 100-10 MG/5ML syrup Take 5 mLs by mouth 3 (three) times daily as needed for cough. 05/09/16   Deatra CanterWilliam J Oxford, FNP  levofloxacin (LEVAQUIN) 500 MG tablet Take 1 tablet (500 mg total) by mouth daily. 05/09/16   Deatra CanterWilliam J Oxford, FNP  methylPREDNISolone (MEDROL DOSEPAK) 4 MG TBPK tablet Take 6-5-4-3-2-1 po qd 05/09/16   Deatra CanterWilliam J Oxford, FNP  ondansetron (ZOFRAN) 4 MG tablet Take 1 tablet (4 mg total) by mouth every 8 (eight) hours as needed for nausea or vomiting. 04/25/16   Sudie GrumblingAnn Berry Amyot, NP   Meds Ordered and Administered this Visit  Medications - No data to display  BP 147/69 (BP Location: Left Arm)   Pulse 60   Temp 100.3 F (37.9 C) (Oral)   Resp 20   SpO2 100%  No data found.   Physical Exam  Constitutional: He appears well-developed and well-nourished.  HENT:  Head: Normocephalic and atraumatic.  Right Ear: External ear normal.  Left Ear: External ear normal.  Mouth/Throat: Oropharynx is clear and moist.  Eyes: Conjunctivae are normal. Pupils are equal, round, and reactive to light.  Neck: Normal range of motion.  Cardiovascular: Normal rate, regular rhythm and normal heart sounds.   Pulmonary/Chest: Effort normal and breath sounds normal.  Abdominal: Soft. Bowel sounds are normal.  Nursing note and vitals reviewed.   Urgent Care Course   Clinical Course    Procedures (including critical care time)  Labs Review Labs Reviewed - No data to display  Imaging Review No results found.   Visual Acuity Review  Right Eye Distance:  Left Eye Distance:   Bilateral Distance:    Right Eye Near:   Left Eye Near:    Bilateral Near:         MDM   1. Bronchitis   2. Subacute maxillary sinusitis    Levaquin 500mg  one po qd x 10 days Medrol dose pack as directed 4mg  #21  Push po fluids, rest, tylenol and motrin otc prn as directed for fever, arthralgias, and myalgias.  Follow up prn if sx's continue or persist.     Deatra Canter, FNP 05/09/16 1910

## 2016-05-09 NOTE — ED Triage Notes (Signed)
[facility] a [facility] of fever, cough and chest congestion. [facility] and prescribed a nausea and cough medicine. The patient reports the symptoms worse now and a fever.

## 2016-10-13 ENCOUNTER — Encounter (HOSPITAL_COMMUNITY): Payer: Self-pay | Admitting: Family Medicine

## 2016-10-13 ENCOUNTER — Ambulatory Visit (HOSPITAL_COMMUNITY)
Admission: EM | Admit: 2016-10-13 | Discharge: 2016-10-13 | Disposition: A | Discharge status: Home / Self Care | Payer: 59 | Attending: Family Medicine | Admitting: Family Medicine

## 2016-10-13 DIAGNOSIS — R6889 Other general symptoms and signs: Secondary | ICD-10-CM | POA: Diagnosis not present

## 2016-10-13 DIAGNOSIS — R519 Headache, unspecified: Secondary | ICD-10-CM

## 2016-10-13 DIAGNOSIS — R51 Headache: Secondary | ICD-10-CM | POA: Diagnosis not present

## 2016-10-13 MED ORDER — KETOROLAC TROMETHAMINE 60 MG/2ML IM SOLN
60.0000 mg | Freq: Once | INTRAMUSCULAR | Status: AC
Start: 2016-10-13 — End: 2016-10-13
  Administered 2016-10-13: 60 mg via INTRAMUSCULAR

## 2016-10-13 MED ORDER — OSELTAMIVIR PHOSPHATE 75 MG PO CAPS: 75.0000 mg | ORAL_CAPSULE | Freq: Two times a day (BID) | ORAL | 0 refills | Status: DC

## 2016-10-13 MED ORDER — KETOROLAC TROMETHAMINE 60 MG/2ML IM SOLN
INTRAMUSCULAR | Status: AC
  Filled 2016-10-13: qty 2

## 2016-10-13 MED ORDER — IBUPROFEN 800 MG PO TABS: 800.0000 mg | ORAL_TABLET | Freq: Three times a day (TID) | ORAL | 0 refills | Status: DC

## 2016-10-13 NOTE — ED Provider Notes (Signed)
CSN: [id number]     Arrival date & time 10/13/16  1613 History   First MD Initiated Contact with Patient 10/13/16 1713     Chief Complaint  Patient presents with  . Cough  . Headache   (Consider location/radiation/quality/duration/timing/severity/associated sxs/prior Treatment) Patient c/o cough and uri sx's.  Patient c/o severe headache   The history is provided by the patient.  Cough  Cough characteristics:  Non-productive Sputum characteristics:  Nondescript Onset quality:  Sudden Timing:  Constant Relieved by:  None tried Associated symptoms: headaches   Headache  Associated symptoms: cough     History reviewed. No pertinent past medical history. Past Surgical History:  Procedure Laterality Date  . KNEE SURGERY     History reviewed. No pertinent family history. Social History  Substance Use Topics  . Smoking status: Former Games developermoker  . Smokeless tobacco: Never Used  . Alcohol use No    Review of Systems  Constitutional: Negative.   HENT: Negative.   Eyes: Negative.   Respiratory: Positive for cough.   Cardiovascular: Negative.   Gastrointestinal: Negative.   Endocrine: Negative.   Musculoskeletal: Negative.   Allergic/Immunologic: Negative.   Neurological: Positive for headaches.  Hematological: Negative.   Psychiatric/Behavioral: Negative.     Allergies  Licorice flavor [artificial licorice flavor] and Amoxicillin  Home Medications   Prior to Admission medications   Medication Sig Start Date End Date Taking? Authorizing Provider  benzonatate (TESSALON) 100 MG capsule Take 1 capsule (100 mg total) by mouth 3 (three) times daily as needed for cough. 04/25/16   Sudie GrumblingAnn Berry Amyot, NP  chlorpheniramine-HYDROcodone [facility](TUSSIONEX PENNKINETIC ER) 10-8 MG/5ML SUER Take 5 mLs by mouth at bedtime as needed for cough. 04/25/16   Sudie GrumblingAnn Berry Amyot, NP  guaiFENesin-codeine (CHERATUSSIN AC) 100-10 MG/5ML syrup Take 5 mLs by mouth 3 (three) times daily as needed for cough.  05/09/16   Deatra CanterWilliam J Oxford, FNP  ibuprofen (ADVIL,MOTRIN) 800 MG tablet Take 1 tablet (800 mg total) by mouth 3 (three) times daily. 10/13/16   Deatra CanterWilliam J Oxford, FNP  levofloxacin (LEVAQUIN) 500 MG tablet Take 1 tablet (500 mg total) by mouth daily. 05/09/16   Deatra CanterWilliam J Oxford, FNP  methylPREDNISolone (MEDROL DOSEPAK) 4 MG TBPK tablet Take 6-5-4-3-2-1 po qd 05/09/16   Deatra CanterWilliam J Oxford, FNP  ondansetron (ZOFRAN) 4 MG tablet Take 1 tablet (4 mg total) by mouth every 8 (eight) hours as needed for nausea or vomiting. 04/25/16   Sudie GrumblingAnn Berry Amyot, NP  oseltamivir (TAMIFLU) 75 MG capsule Take 1 capsule (75 mg total) by mouth every 12 (twelve) hours. 10/13/16   Deatra CanterWilliam J Oxford, FNP   Meds Ordered and Administered this Visit   Medications  ketorolac (TORADOL) injection 60 mg (60 mg Intramuscular Given 10/13/16 1726)    There were no vitals taken for this visit. No data found.   Physical Exam  Constitutional: He appears well-developed and well-nourished.  HENT:  Head: Normocephalic and atraumatic.  Right Ear: External ear normal.  Left Ear: External ear normal.  Mouth/Throat: Oropharynx is clear and moist.  Eyes: Conjunctivae and EOM are normal. Pupils are equal, round, and reactive to light.  Neck: Normal range of motion. Neck supple.  Cardiovascular: Normal rate, regular rhythm and normal heart sounds.   Pulmonary/Chest: Effort normal and breath sounds normal.  Abdominal: Soft. Bowel sounds are normal.  Nursing note and vitals reviewed.   Urgent Care Course     Procedures (including critical care time)  Labs Review Labs Reviewed - No data to  display  Imaging Review No results found.   Visual Acuity Review  Right Eye Distance:   Left Eye Distance:   Bilateral Distance:    Right Eye Near:   Left Eye Near:    Bilateral Near:         MDM   1. Flu-like symptoms   2. Bad headache    tamiflu Motrin toradol 60mg  IM    Deatra Canter, FNP 10/13/16 1753

## 2016-10-13 NOTE — ED Triage Notes (Addendum)
Pt here for body aches, headache, cough and sore throat x 4 days ago.

## 2016-10-14 ENCOUNTER — Encounter (HOSPITAL_COMMUNITY): Payer: Self-pay | Admitting: Emergency Medicine

## 2016-10-14 ENCOUNTER — Emergency Department (HOSPITAL_COMMUNITY)
Admission: EM | Admit: 2016-10-14 | Discharge: 2016-10-15 | Disposition: A | Discharge status: Home / Self Care | Payer: 59 | Attending: Emergency Medicine | Admitting: Emergency Medicine

## 2016-10-14 ENCOUNTER — Emergency Department (HOSPITAL_COMMUNITY): Payer: 59

## 2016-10-14 DIAGNOSIS — B9689 Other specified bacterial agents as the cause of diseases classified elsewhere: Secondary | ICD-10-CM | POA: Diagnosis not present

## 2016-10-14 DIAGNOSIS — R0981 Nasal congestion: Secondary | ICD-10-CM | POA: Diagnosis present

## 2016-10-14 DIAGNOSIS — Z79899 Other long term (current) drug therapy: Secondary | ICD-10-CM | POA: Insufficient documentation

## 2016-10-14 DIAGNOSIS — R1111 Vomiting without nausea: Secondary | ICD-10-CM | POA: Diagnosis not present

## 2016-10-14 DIAGNOSIS — J038 Acute tonsillitis due to other specified organisms: Secondary | ICD-10-CM | POA: Insufficient documentation

## 2016-10-14 DIAGNOSIS — Z87891 Personal history of nicotine dependence: Secondary | ICD-10-CM | POA: Diagnosis not present

## 2016-10-14 DIAGNOSIS — R1110 Vomiting, unspecified: Secondary | ICD-10-CM

## 2016-10-14 LAB — CBC WITH DIFFERENTIAL/PLATELET
Basophils Absolute: 0 10*3/uL (ref 0.0–0.1)
Basophils Relative: 0 %
EOS PCT: 0 %
Eosinophils Absolute: 0 10*3/uL (ref 0.0–0.7)
HCT: 36 % — ABNORMAL LOW (ref 39.0–52.0)
Hemoglobin: 12.5 g/dL — ABNORMAL LOW (ref 13.0–17.0)
LYMPHS ABS: 1.8 10*3/uL (ref 0.7–4.0)
LYMPHS PCT: 10 %
MCH: 28.5 pg (ref 26.0–34.0)
MCHC: 34.7 g/dL (ref 30.0–36.0)
MCV: 82 fL (ref 78.0–100.0)
Monocytes Absolute: 1.4 10*3/uL — ABNORMAL HIGH (ref 0.1–1.0)
Monocytes Relative: 8 %
Neutro Abs: 14.6 10*3/uL — ABNORMAL HIGH (ref 1.7–7.7)
Neutrophils Relative %: 82 %
PLATELETS: 166 10*3/uL (ref 150–400)
RBC: 4.39 MIL/uL (ref 4.22–5.81)
RDW: 13.1 % (ref 11.5–15.5)
WBC: 17.8 10*3/uL — ABNORMAL HIGH (ref 4.0–10.5)

## 2016-10-14 LAB — COMPREHENSIVE METABOLIC PANEL
ALT: 18 U/L (ref 17–63)
AST: 18 U/L (ref 15–41)
Albumin: 4.4 g/dL (ref 3.5–5.0)
Alkaline Phosphatase: 60 U/L (ref 38–126)
Anion gap: 9 (ref 5–15)
BUN: 11 mg/dL (ref 6–20)
CHLORIDE: 99 mmol/L — AB (ref 101–111)
CO2: 28 mmol/L (ref 22–32)
CREATININE: 1.15 mg/dL (ref 0.61–1.24)
Calcium: 8.9 mg/dL (ref 8.9–10.3)
Glucose, Bld: 102 mg/dL — ABNORMAL HIGH (ref 65–99)
Potassium: 3.1 mmol/L — ABNORMAL LOW (ref 3.5–5.1)
Sodium: 136 mmol/L (ref 135–145)
TOTAL PROTEIN: 7.8 g/dL (ref 6.5–8.1)
Total Bilirubin: 1.3 mg/dL — ABNORMAL HIGH (ref 0.3–1.2)

## 2016-10-14 LAB — URINALYSIS, ROUTINE W REFLEX MICROSCOPIC
BILIRUBIN URINE: NEGATIVE
GLUCOSE, UA: NEGATIVE mg/dL
HGB URINE DIPSTICK: NEGATIVE
KETONES UR: NEGATIVE mg/dL
LEUKOCYTES UA: NEGATIVE
Nitrite: NEGATIVE
PROTEIN: NEGATIVE mg/dL
Specific Gravity, Urine: 1.013 (ref 1.005–1.030)
pH: 7 (ref 5.0–8.0)

## 2016-10-14 LAB — LIPASE, BLOOD: LIPASE: 14 U/L (ref 11–51)

## 2016-10-14 LAB — RAPID STREP SCREEN (MED CTR MEBANE ONLY): STREPTOCOCCUS, GROUP A SCREEN (DIRECT): NEGATIVE

## 2016-10-14 MED ORDER — IOPAMIDOL (ISOVUE-300) INJECTION 61%
INTRAVENOUS | Status: AC
  Administered 2016-10-14: 75 mL via INTRAVENOUS
  Filled 2016-10-14: qty 75

## 2016-10-14 MED ORDER — CLINDAMYCIN PHOSPHATE 600 MG/50ML IV SOLN
600.0000 mg | Freq: Once | INTRAVENOUS | Status: AC
  Administered 2016-10-14: 600 mg via INTRAVENOUS
  Filled 2016-10-14: qty 50

## 2016-10-14 MED ORDER — SODIUM CHLORIDE 0.9 % IV BOLUS (SEPSIS)
1000.0000 mL | Freq: Once | INTRAVENOUS | Status: AC
  Administered 2016-10-14: 1000 mL via INTRAVENOUS

## 2016-10-14 MED ORDER — ONDANSETRON HCL 4 MG/2ML IJ SOLN
4.0000 mg | Freq: Once | INTRAMUSCULAR | Status: AC
  Administered 2016-10-14: 4 mg via INTRAVENOUS
  Filled 2016-10-14: qty 2

## 2016-10-14 MED ORDER — KETOROLAC TROMETHAMINE 30 MG/ML IJ SOLN
30.0000 mg | Freq: Once | INTRAMUSCULAR | Status: AC
  Administered 2016-10-14: 30 mg via INTRAVENOUS
  Filled 2016-10-14: qty 1

## 2016-10-14 MED ORDER — SODIUM CHLORIDE 0.9 % IJ SOLN
INTRAMUSCULAR | Status: AC
  Filled 2016-10-14: qty 50

## 2016-10-14 MED ORDER — ACETAMINOPHEN 325 MG PO TABS
650.0000 mg | ORAL_TABLET | ORAL | Status: DC | PRN
  Administered 2016-10-14: 650 mg via ORAL
  Filled 2016-10-14: qty 2

## 2016-10-14 MED ORDER — CLINDAMYCIN HCL 300 MG PO CAPS: 300.0000 mg | ORAL_CAPSULE | Freq: Four times a day (QID) | ORAL | 0 refills | Status: AC

## 2016-10-14 MED ORDER — HYDROCODONE-ACETAMINOPHEN 5-325 MG PO TABS: 1.0000 | ORAL_TABLET | ORAL | 0 refills | Status: DC | PRN

## 2016-10-14 MED ORDER — ONDANSETRON HCL 4 MG PO TABS: 4.0000 mg | ORAL_TABLET | Freq: Four times a day (QID) | ORAL | 0 refills | Status: DC | PRN

## 2016-10-14 MED ORDER — DEXAMETHASONE SODIUM PHOSPHATE 10 MG/ML IJ SOLN
10.0000 mg | Freq: Four times a day (QID) | INTRAMUSCULAR | Status: DC
  Administered 2016-10-14: 10 mg via INTRAVENOUS
  Filled 2016-10-14: qty 1

## 2016-10-14 MED ORDER — POTASSIUM CHLORIDE CRYS ER 20 MEQ PO TBCR
40.0000 meq | EXTENDED_RELEASE_TABLET | Freq: Once | ORAL | Status: AC
  Administered 2016-10-14: 40 meq via ORAL
  Filled 2016-10-14: qty 2

## 2016-10-14 NOTE — ED Triage Notes (Signed)
Per pt, states diagnosed with the flu yesterday-states his symptoms have not improved-states unable to keep fluids down-states no relief with tamaflu, advil

## 2016-10-14 NOTE — ED Notes (Signed)
Patient transported to X-ray 

## 2016-10-14 NOTE — ED Provider Notes (Signed)
[facility]-EMERGENCY DEPT Provider Note   CSN: [id number] Arrival date & time: 10/14/16  1620     History   Chief Complaint Chief Complaint  Patient presents with  . flu/vomiting    HPI Dale Evans is a 26 y.o. male.  HPI Patient was seen in urgent care yesterday and diagnosed with flulike illness. Was started on 800 mg of ibuprofen and Tamiflu. States she's had 4 days of nasal congestion. He began having fever, chills, myalgias, headache and sore throat 2 days ago. He started vomiting today. States he's vomited about 8 times.  No gross blood in stool or melanotic stools. States he's taken 2 doses of Tamiflu and 2 doses of ibuprofen. He now has developed cough but denies any shortness of breath. History reviewed. No pertinent past medical history.  There are no active problems to display for this patient.   Past Surgical History:  Procedure Laterality Date  . KNEE SURGERY         Home Medications    Prior to Admission medications   Medication Sig Start Date End Date Taking? Authorizing Provider  ibuprofen (ADVIL,MOTRIN) 800 MG tablet Take 1 tablet (800 mg total) by mouth 3 (three) times daily. 10/13/16  Yes Deatra Canter, FNP  oseltamivir (TAMIFLU) 75 MG capsule Take 1 capsule (75 mg total) by mouth every 12 (twelve) hours. 10/13/16  Yes Deatra Canter, FNP  benzonatate (TESSALON) 100 MG capsule Take 1 capsule (100 mg total) by mouth 3 (three) times daily as needed for cough. Patient not taking: Reported on 10/14/2016 04/25/16   Sudie Grumbling, NP  chlorpheniramine-HYDROcodone [facility]) 10-8 MG/5ML SUER Take 5 mLs by mouth at bedtime as needed for cough. Patient not taking: Reported on 10/14/2016 04/25/16   Sudie Grumbling, NP  guaiFENesin-codeine [facility]) 100-10 MG/5ML syrup Take 5 mLs by mouth 3 (three) times daily as needed for cough. Patient not taking: Reported on 10/14/2016 05/09/16   Deatra Canter, FNP  levofloxacin (LEVAQUIN) 500 MG tablet Take 1  tablet (500 mg total) by mouth daily. Patient not taking: Reported on 10/14/2016 05/09/16   Deatra Canter, FNP  methylPREDNISolone (MEDROL DOSEPAK) 4 MG TBPK tablet Take 6-5-4-3-2-1 po qd Patient not taking: Reported on 10/14/2016 05/09/16   Deatra Canter, FNP  ondansetron (ZOFRAN) 4 MG tablet Take 1 tablet (4 mg total) by mouth every 8 (eight) hours as needed for nausea or vomiting. Patient not taking: Reported on 10/14/2016 04/25/16   Sudie Grumbling, NP    Family History No family history on file.  Social History Social History  Substance Use Topics  . Smoking status: Former Games developer  . Smokeless tobacco: Never Used  . Alcohol use No     Allergies   Licorice flavor [artificial licorice flavor] and Amoxicillin   Review of Systems Review of Systems  Constitutional: Positive for appetite change, chills, fatigue and fever.  HENT: Positive for congestion, postnasal drip, rhinorrhea and sore throat. Negative for sinus pressure, trouble swallowing and voice change.   Respiratory: Positive for cough. Negative for shortness of breath and wheezing.   Cardiovascular: Negative for chest pain, palpitations and leg swelling.  Gastrointestinal: Positive for nausea and vomiting. Negative for abdominal pain, blood in stool, constipation and diarrhea.  Genitourinary: Negative for difficulty urinating, dysuria, flank pain, frequency and hematuria.  Musculoskeletal: Positive for back pain and myalgias. Negative for neck pain and neck stiffness.  Skin: Negative for rash and wound.  Neurological: Positive for headaches. Negative  for dizziness, syncope, weakness, light-headedness and numbness.  All other systems reviewed and are negative.    Physical Exam Updated Vital Signs BP 136/79 (BP Location: Right Arm)   Pulse 84   Temp 100 F (37.8 C) (Oral)   Resp 19   Ht 6\' 2"  (1.88 m)   Wt (!) 301 lb (136.5 kg)   SpO2 98%   BMI 38.65 kg/m   Physical Exam  Constitutional: He is oriented to  person, place, and time. He appears well-developed and well-nourished. No distress.  Mildly muffled voice  HENT:  Head: Normocephalic and atraumatic.  Mouth/Throat: Oropharynx is clear and moist.  Bilateral nasal mucosal edema. Oropharynx with bilaterally enlarged tonsils with exudate. Uvula is midline.  Eyes: EOM are normal. Pupils are equal, round, and reactive to light.  Neck: Normal range of motion. Neck supple.  Anterior cervical adenopathy. No meningismus.  Cardiovascular: Regular rhythm.   Tachycardia  Pulmonary/Chest: Effort normal and breath sounds normal. No respiratory distress. He has no wheezes. He exhibits no tenderness.  Abdominal: Soft. Bowel sounds are normal. He exhibits no distension and no mass. There is no tenderness (no tenderness to palpation. no rebound or guarding). There is no rebound and no guarding. No hernia.  Musculoskeletal: Normal range of motion. He exhibits no edema or tenderness.  No CVA tenderness bilaterally. No midline thoracic or lumbar tenderness. Patient does have mild bilateral lumbar paraspinal tenderness to palpation. No lower extremity swelling, asymmetry or tenderness.  Lymphadenopathy:    He has cervical adenopathy.  Neurological: He is alert and oriented to person, place, and time.  Moving all extremities without deficit. Sensation fully intact.  Skin: Skin is warm and dry. No rash noted. No erythema.  Psychiatric: He has a normal mood and affect. His behavior is normal.  Nursing note and vitals reviewed.    ED Treatments / Results  Labs (all labs ordered are listed, but only abnormal results are displayed) Labs Reviewed  CBC WITH DIFFERENTIAL/PLATELET - Abnormal; Notable for the following:       Result Value   WBC 17.8 (*)    Hemoglobin 12.5 (*)    HCT 36.0 (*)    Neutro Abs 14.6 (*)    Monocytes Absolute 1.4 (*)    All other components within normal limits  COMPREHENSIVE METABOLIC PANEL - Abnormal; Notable for the following:     Potassium 3.1 (*)    Chloride 99 (*)    Glucose, Bld 102 (*)    Total Bilirubin 1.3 (*)    All other components within normal limits  RAPID STREP SCREEN (NOT AT [facility])  CULTURE, GROUP A STREP ([facility])  LIPASE, BLOOD  URINALYSIS, ROUTINE W REFLEX MICROSCOPIC    EKG  EKG Interpretation None       Radiology Dg Chest 2 View  Result Date: 10/14/2016 CLINICAL DATA:  Flu-like symptoms EXAM: CHEST  2 VIEW COMPARISON:  None. FINDINGS: The heart size and mediastinal contours are within normal limits. Both lungs are clear. The visualized skeletal structures are unremarkable. IMPRESSION: No active cardiopulmonary disease. Electronically Signed   By: Alcide Clever M.D.   On: 10/14/2016 19:52   Ct Soft Tissue Neck W Contrast  Result Date: 10/14/2016 CLINICAL DATA:  Flu symptoms, sore throat and voice changes. EXAM: CT NECK WITH CONTRAST TECHNIQUE: Multidetector CT imaging of the neck was performed using the standard protocol following the bolus administration of intravenous contrast. CONTRAST:  1 ISOVUE-300 IOPAMIDOL (ISOVUE-300) INJECTION 61% COMPARISON:  None. FINDINGS: Pharynx and larynx: Enlarged  adenoids. Enlarged palatine tonsils with striated enhancement Narrowed though patent airway. No focal fluid collection. Mild parapharyngeal fat stranding. Normal appearance of the epiglottis and larynx. Salivary glands: Normal. Thyroid: Normal. Lymph nodes: Enlarged bilateral level IIa lymph nodes measuring up to 13 mm without necrosis or pericapsular fat stranding appear reactive. Additional prominent though not pathologically enlarged cervical lymph nodes. Vascular: Normal. Limited intracranial: Normal. Visualized orbits: Normal. Mastoids and visualized paranasal sinuses: Mild paranasal sinus mucosal thickening without air-fluid levels. Mastoid air cells are well aerated. Skeleton: Bone islands LEFT parasymphyseal mandible. No acute osseous process or destructive bony lesions. Calcified stylohyoid ligaments.  Upper chest: Lung apices are clear. No superior mediastinal lymphadenopathy. Other: Normal. IMPRESSION: Acute tonsillitis without abscess.  Narrowed though patent airway. Generalized lymphoid hyperplasia attributable to recent viral illness, less likely related to lymphoproliferative disease. Electronically Signed   By: Awilda Metroourtnay  Bloomer M.D.   On: 10/14/2016 22:05    Procedures Procedures (including critical care time)  Medications Ordered in ED Medications  acetaminophen (TYLENOL) tablet 650 mg (650 mg Oral Given 10/14/16 1849)  dexamethasone (DECADRON) injection 10 mg (not administered)  sodium chloride 0.9 % injection (  Not Given 10/14/16 2204)  sodium chloride 0.9 % bolus 1,000 mL (0 mLs Intravenous Stopped 10/14/16 2036)  ondansetron (ZOFRAN) injection 4 mg (4 mg Intravenous Given 10/14/16 1845)  potassium chloride SA (K-DUR,KLOR-CON) CR tablet 40 mEq (40 mEq Oral Given 10/14/16 2035)  clindamycin (CLEOCIN) IVPB 600 mg (600 mg Intravenous New Bag/Given 10/14/16 2201)  sodium chloride 0.9 % bolus 1,000 mL (1,000 mLs Intravenous New Bag/Given 10/14/16 2131)  ketorolac (TORADOL) 30 MG/ML injection 30 mg (30 mg Intravenous Given 10/14/16 2131)  iopamidol (ISOVUE-300) 61 % injection (75 mLs Intravenous Contrast Given 10/14/16 2143)     Initial Impression / Assessment and Plan / ED Course  I have reviewed the triage vital signs and the nursing notes.  Pertinent labs & imaging results that were available during my care of the patient were reviewed by me and considered in my medical decision making (see chart for details).    Patient states he is feeling much better after IV Decadron, IV fluid and Toradol. CT soft tissue neck with IV contrast demonstrates tonsillitis and lymphadenopathy. No evidence of abscess. Given dose of IV clindamycin. Advised to follow-up with ENT. Suspect vomiting related to Tamiflu. Understands need to return immediately for any worsening of his symptoms, persistent vomiting, voice  changes, difficulty breathing or for any concerns.   Final Clinical Impressions(s) / ED Diagnoses   Final diagnoses:  Acute bacterial tonsillitis  Non-intractable vomiting, presence of nausea not specified, unspecified vomiting type    New Prescriptions New Prescriptions   No medications on file     Loren Raceravid Marti Mclane, MD 10/14/16 2246

## 2016-10-17 LAB — CULTURE, GROUP A STREP (THRC)

## 2017-10-31 ENCOUNTER — Encounter (HOSPITAL_COMMUNITY): Payer: Self-pay | Admitting: Emergency Medicine

## 2017-10-31 ENCOUNTER — Ambulatory Visit (HOSPITAL_COMMUNITY)
Admission: EM | Admit: 2017-10-31 | Discharge: 2017-10-31 | Disposition: A | Discharge status: Home / Self Care | Payer: 59 | Attending: Family Medicine | Admitting: Family Medicine

## 2017-10-31 ENCOUNTER — Other Ambulatory Visit: Payer: Self-pay

## 2017-10-31 DIAGNOSIS — R69 Illness, unspecified: Secondary | ICD-10-CM | POA: Diagnosis not present

## 2017-10-31 DIAGNOSIS — J111 Influenza due to unidentified influenza virus with other respiratory manifestations: Secondary | ICD-10-CM

## 2017-10-31 MED ORDER — OSELTAMIVIR PHOSPHATE 75 MG PO CAPS: 75.0000 mg | ORAL_CAPSULE | Freq: Two times a day (BID) | ORAL | 0 refills | Status: AC

## 2017-10-31 MED ORDER — HYDROCODONE-HOMATROPINE 5-1.5 MG/5ML PO SYRP: 5.0000 mL | ORAL_SOLUTION | Freq: Four times a day (QID) | ORAL | 0 refills | Status: DC | PRN

## 2017-10-31 NOTE — Discharge Instructions (Signed)

## 2017-10-31 NOTE — ED Provider Notes (Signed)
[facility]   [id number] 10/31/17 Arrival Time: 1159  ASSESSMENT & PLAN:  1. Influenza-like illness     Meds ordered this encounter  Medications  . oseltamivir (TAMIFLU) 75 MG capsule    Sig: Take 1 capsule (75 mg total) by mouth 2 (two) times daily for 5 days.    Dispense:  10 capsule    Refill:  0  . HYDROcodone-homatropine (HYCODAN) 5-1.5 MG/5ML syrup    Sig: Take 5 mLs by mouth every 6 (six) hours as needed for cough.    Dispense:  90 mL    Refill:  0   Work note given. Cough medication sedation precautions. Discussed typical duration of symptoms. OTC symptom care as needed. Ensure adequate fluid intake and rest. May f/u with PCP or here as needed.  Reviewed expectations re: course of current medical issues. Questions answered. Outlined signs and symptoms indicating need for more acute intervention. Patient verbalized understanding. After Visit Summary given.   SUBJECTIVE: History from: patient.  Jeanmarie Plantevin D Fawbush is a 27 y.o. male who presents with complaint of nasal congestion, post-nasal drainage, and a persistent dry cough. Onset abrupt, approximately 2 days ago. Overall fatigued with body aches. SOB: none. Wheezing: none. Fever: subjective. Overall normal PO intake without n/v. Sick contacts: no. OTC treatment: none reported.  Received flu shot this year: no.  Social History   Tobacco Use  Smoking Status Former Smoker  Smokeless Tobacco Never Used    ROS: As per HPI.   OBJECTIVE:  Vitals:   10/31/17 1254  BP: (!) 145/89  Pulse: 60  Resp: 18  Temp: 98.5 F (36.9 C)  SpO2: 100%     General appearance: alert; appears fatigued HEENT: nasal congestion; clear runny nose; throat irritation secondary to post-nasal drainage Neck: supple without LAD Lungs: unlabored respirations, symmetrical air entry; cough: moderate; no respiratory distress Skin: warm and dry Psychological: alert and cooperative; normal mood and affect  Imaging: No  results found.  Allergies  Allergen Reactions  . Licorice Flavor [Artificial Licorice Flavor] Anaphylaxis  . Amoxicillin Nausea And Vomiting and Swelling    Childhood allergy. Has patient had a PCN reaction causing immediate rash, facial/tongue/throat swelling, SOB or lightheadedness with hypotension: No Has patient had a PCN reaction causing severe rash involving mucus membranes or skin necrosis: No Has patient had a PCN reaction that required hospitalization No Has patient had a PCN reaction occurring within the last 10 years: No If all of the above answers are "NO", then may proceed with Cephalosporin use.     Social History   Socioeconomic History  . Marital status: Single    Spouse name: Not on file  . Number of children: Not on file  . Years of education: Not on file  . Highest education level: Not on file  Social Needs  . Financial resource strain: Not on file  . Food insecurity - worry: Not on file  . Food insecurity - inability: Not on file  . Transportation needs - medical: Not on file  . Transportation needs - non-medical: Not on file  Occupational History  . Not on file  Tobacco Use  . Smoking status: Former Games developermoker  . Smokeless tobacco: Never Used  Substance and Sexual Activity  . Alcohol use: No  . Drug use: No  . Sexual activity: Not on file  Other Topics Concern  . Not on file  Social History Narrative  . Not on file  Mardella Layman, MD 10/31/17 1343

## 2017-10-31 NOTE — ED Triage Notes (Signed)
Pt states hes had a headache x2 days, last night feeling congestion, body aches, fatigue.

## 2018-06-14 ENCOUNTER — Other Ambulatory Visit: Payer: Self-pay

## 2018-06-14 ENCOUNTER — Emergency Department (HOSPITAL_COMMUNITY): Payer: 59

## 2018-06-14 ENCOUNTER — Encounter (HOSPITAL_COMMUNITY): Payer: Self-pay | Admitting: Emergency Medicine

## 2018-06-14 ENCOUNTER — Emergency Department (HOSPITAL_COMMUNITY)
Admission: EM | Admit: 2018-06-14 | Discharge: 2018-06-15 | Disposition: A | Discharge status: Home / Self Care | Payer: 59 | Attending: Emergency Medicine | Admitting: Emergency Medicine

## 2018-06-14 DIAGNOSIS — S4992XA Unspecified injury of left shoulder and upper arm, initial encounter: Secondary | ICD-10-CM | POA: Diagnosis present

## 2018-06-14 DIAGNOSIS — Y92009 Unspecified place in unspecified non-institutional (private) residence as the place of occurrence of the external cause: Secondary | ICD-10-CM | POA: Diagnosis not present

## 2018-06-14 DIAGNOSIS — S43102A Unspecified dislocation of left acromioclavicular joint, initial encounter: Secondary | ICD-10-CM | POA: Insufficient documentation

## 2018-06-14 DIAGNOSIS — Y9389 Activity, other specified: Secondary | ICD-10-CM | POA: Insufficient documentation

## 2018-06-14 DIAGNOSIS — W19XXXA Unspecified fall, initial encounter: Secondary | ICD-10-CM

## 2018-06-14 DIAGNOSIS — Y998 Other external cause status: Secondary | ICD-10-CM | POA: Diagnosis not present

## 2018-06-14 DIAGNOSIS — W109XXA Fall (on) (from) unspecified stairs and steps, initial encounter: Secondary | ICD-10-CM | POA: Diagnosis not present

## 2018-06-14 MED ORDER — MORPHINE SULFATE (PF) 4 MG/ML IV SOLN
4.0000 mg | Freq: Once | INTRAVENOUS | Status: AC
  Administered 2018-06-14: 4 mg via INTRAVENOUS
  Filled 2018-06-14: qty 1

## 2018-06-14 NOTE — ED Triage Notes (Addendum)
Pt was moving a dresser up stairs approx 30 min ago and fell hitting L shoulder.  States he felt a pop.  Sling placed on L arm on arrival.  Denies LOC.  Denies neck or back pain.

## 2018-06-14 NOTE — ED Notes (Signed)
Patient transported to X-ray 

## 2018-06-14 NOTE — ED Provider Notes (Signed)
[facility] EMERGENCY DEPARTMENT Provider Note   CSN: [id number] Arrival date & time: 06/14/18  2128     History   Chief Complaint Chief Complaint  Patient presents with  . Shoulder Pain    HPI Dale Evans is a 27 y.o. male.  HPI   Patient is a 27 year old male with a history of knee surgery and tonsillectomy who presents emergency department today complaining of left shoulder pain.  Patient states that he was moving a dresser up the stairs about 30 minutes prior to arrival when he lost his balance and fell down 6 steps landing on his left shoulder.  He denies head trauma or LOC.  States he felt a pop in his shoulder and had immediate severe pain.  Pain worse with movement and palpation.  He took 1000mg  tylenol pta with no improvement of sxs  History reviewed. No pertinent past medical history.  There are no active problems to display for this patient.   Past Surgical History:  Procedure Laterality Date  . KNEE SURGERY    . TONSILLECTOMY          Home Medications    Prior to Admission medications   Medication Sig Start Date End Date Taking? Authorizing Provider  benzonatate (TESSALON) 100 MG capsule Take 1 capsule (100 mg total) by mouth 3 (three) times daily as needed for cough. Patient not taking: Reported on 10/14/2016 04/25/16   Sudie Grumbling, NP  chlorpheniramine-HYDROcodone [facility]) 10-8 MG/5ML SUER Take 5 mLs by mouth at bedtime as needed for cough. Patient not taking: Reported on 10/14/2016 04/25/16   Sudie Grumbling, NP  guaiFENesin-codeine (CHERATUSSIN AC) 100-10 MG/5ML syrup Take 5 mLs by mouth 3 (three) times daily as needed for cough. Patient not taking: Reported on 10/14/2016 05/09/16   Deatra Canter, FNP  HYDROcodone-acetaminophen (NORCO/VICODIN) 5-325 MG tablet Take 1 tablet by mouth every 6 (six) hours as needed. 06/15/18   Hanadi Stanly S, PA-C  HYDROcodone-homatropine (HYCODAN) 5-1.5 MG/5ML syrup Take 5 mLs by  mouth every 6 (six) hours as needed for cough. 10/31/17   Mardella Layman, MD  ibuprofen (ADVIL,MOTRIN) 800 MG tablet Take 1 tablet (800 mg total) by mouth 3 (three) times daily. 10/13/16   Deatra Canter, FNP  levofloxacin (LEVAQUIN) 500 MG tablet Take 1 tablet (500 mg total) by mouth daily. Patient not taking: Reported on 10/14/2016 05/09/16   Deatra Canter, FNP  methylPREDNISolone (MEDROL DOSEPAK) 4 MG TBPK tablet Take 6-5-4-3-2-1 po qd Patient not taking: Reported on 10/14/2016 05/09/16   Deatra Canter, FNP  ondansetron (ZOFRAN) 4 MG tablet Take 1 tablet (4 mg total) by mouth every 6 (six) hours as needed for nausea or vomiting. Patient not taking: Reported on 10/31/2017 10/14/16   Loren Racer, MD    Family History No family history on file.  Social History Social History   Tobacco Use  . Smoking status: Former Games developer  . Smokeless tobacco: Never Used  Substance Use Topics  . Alcohol use: No  . Drug use: No     Allergies   Licorice flavor [artificial licorice flavor] and Amoxicillin   Review of Systems Review of Systems  Constitutional: Negative for fever.  Eyes: Negative for visual disturbance.  Respiratory: Negative for shortness of breath.   Cardiovascular: Negative for chest pain.  Gastrointestinal: Negative for abdominal pain and vomiting.  Musculoskeletal: Negative for back pain and neck pain.       Left shoulder pain  Skin: Negative  for wound.  Neurological: Negative for dizziness, weakness, light-headedness, numbness and headaches.       No head trauma or loc     Physical Exam Updated Vital Signs BP (!) 146/86 (BP Location: Right Arm)   Pulse 98   Temp 98.6 F (37 C) (Oral)   Resp 20   Ht 6\' 3"  (1.905 m)   Wt (!) 140.6 kg   SpO2 99%   BMI 38.75 kg/m   Physical Exam  Constitutional: He appears well-developed and well-nourished.  HENT:  Head: Normocephalic and atraumatic.  Eyes: Conjunctivae are normal.  Neck: Neck supple.  Cardiovascular:  Normal rate, regular rhythm and normal heart sounds.  No murmur heard. Pulmonary/Chest: Effort normal and breath sounds normal. No respiratory distress.  Abdominal: Soft. There is no tenderness.  Musculoskeletal: He exhibits no edema.  No TTP to the cervical, thoracic, or lumbar spine. No pain to the paraspinous muscles. TTP to the left clavicle and left anterior shoulder. Pt in sling and states he cannot range his arm due to severe pain. Distal radial pulse intact. NVI distally. 5/5 grip strength, flexion/extension at wrist. No TTP to the elbow or wrist.  Neurological: He is alert.  Skin: Skin is warm and dry.  Psychiatric: He has a normal mood and affect.  Nursing note and vitals reviewed.   ED Treatments / Results  Labs (all labs ordered are listed, but only abnormal results are displayed) Labs Reviewed - No data to display  EKG None  Radiology Dg Clavicle Left  Result Date: 06/14/2018 CLINICAL DATA:  Recent fall hitting left shoulder, popping sensation EXAM: LEFT CLAVICLE - 2+ VIEWS COMPARISON:  10/14/2016 FINDINGS: There is cephalad displacement of the distal clavicle in relation to the acromion compatible with a AC joint separation, type 3. No associated fracture. Clavicle intact. Visualized chest unremarkable. Glenohumeral joint aligned. No other joint abnormality. IMPRESSION: AC joint separation, type 3. No associated fracture Electronically Signed   By: Judie Petit.  Shick M.D.   On: 06/14/2018 22:51   Dg Shoulder Left  Result Date: 06/14/2018 CLINICAL DATA:  Pain after trauma EXAM: LEFT SHOULDER - 2+ VIEW COMPARISON:  None. FINDINGS: There is no evidence of fracture or dislocation. There is no evidence of arthropathy or other focal bone abnormality. Soft tissues are unremarkable. IMPRESSION: Negative. Electronically Signed   By: Gerome Sam III M.D   On: 06/14/2018 22:50    Procedures Procedures (including critical care time)  Medications Ordered in ED Medications    oxyCODONE-acetaminophen (PERCOCET) 7.5-325 MG per tablet 1 tablet (has no administration in time range)  morphine 4 MG/ML injection 4 mg (4 mg Intravenous Given 06/14/18 2235)     Initial Impression / Assessment and Plan / ED Course  I have reviewed the triage vital signs and the nursing notes.  Pertinent labs & imaging results that were available during my care of the patient were reviewed by me and considered in my medical decision making (see chart for details).    Discussed pt presentation and exam findings with Dr. Adela Lank, who agrees with the plan to place pt in a sling and have him f/u with ortho.  [facility] narcotic database reviewed and there are no red flags.  Final Clinical Impressions(s) / ED Diagnoses   Final diagnoses:  AC separation, type 3, left, initial encounter  Fall, initial encounter   Presenting the ED complaining of left shoulder pain that began after he fell down 6 stairs prior to arrival.  Denies any other injuries including  no head trauma, neck pain or back pain.  Has tenderness to the left clavicle and to the anterior left shoulder with decreased range of motion due to pain.  Patient is neurovascularly intact distally.  X-ray of the left clavicle and shoulder negative for acute fracture, no shoulder dislocation, but does show a type III AC separation.  Will place patient in sling and have him follow-up with the orthopedic doctor as an outpatient.  Have advised him to return to the ER for any new or worsening symptoms in the meantime.  Patient and wife at bedside understand the plan agrees to return immediately to the ED.  All questions answered.  ED Discharge Orders         Ordered    HYDROcodone-acetaminophen (NORCO/VICODIN) 5-325 MG tablet  Every 6 hours PRN     06/15/18 0007           Jnyah Brazee S, PA-C 06/15/18 0007    Melene Plan, DO 06/17/18 1208

## 2018-06-15 MED ORDER — HYDROCODONE-ACETAMINOPHEN 5-325 MG PO TABS: 1.0000 | ORAL_TABLET | Freq: Four times a day (QID) | ORAL | 0 refills | Status: DC | PRN

## 2018-06-15 MED ORDER — OXYCODONE-ACETAMINOPHEN 7.5-325 MG PO TABS
1.0000 | ORAL_TABLET | Freq: Once | ORAL | Status: AC
  Administered 2018-06-15: 1 via ORAL
  Filled 2018-06-15: qty 1

## 2018-06-15 NOTE — Discharge Instructions (Signed)
Prescription given for Norco. Take medication as directed and do not operate machinery, drive a car, or work while taking this medication as it can make you drowsy.   Please follow-up with the orthopedic doctor next week for reevaluation.  Please return to the emergency department for any new or worsening symptoms in the meantime.

## 2019-03-10 ENCOUNTER — Other Ambulatory Visit: Payer: Self-pay

## 2019-03-10 ENCOUNTER — Ambulatory Visit (HOSPITAL_COMMUNITY)
Admission: EM | Admit: 2019-03-10 | Discharge: 2019-03-10 | Disposition: A | Discharge status: Home / Self Care | Payer: No Typology Code available for payment source | Attending: Family Medicine | Admitting: Family Medicine

## 2019-03-10 ENCOUNTER — Encounter (HOSPITAL_COMMUNITY): Payer: Self-pay | Admitting: Emergency Medicine

## 2019-03-10 DIAGNOSIS — R42 Dizziness and giddiness: Secondary | ICD-10-CM

## 2019-03-10 DIAGNOSIS — R5383 Other fatigue: Secondary | ICD-10-CM

## 2019-03-10 DIAGNOSIS — Z20822 Contact with and (suspected) exposure to covid-19: Secondary | ICD-10-CM

## 2019-03-10 NOTE — ED Triage Notes (Addendum)
patient reports on Saturday night he started feeling badly.  Patient has dizziness.  Patient says this is different from vertigo Patient has been tired Denies fever Denies cough Patient has a loss of appetite No vomiting Patient had diarrhea last week, 3-4 days

## 2019-03-10 NOTE — Discharge Instructions (Addendum)
Home to rest Must quarantine until results are in Tylenol for pain and fever     Person Under Monitoring Name: Dale Evans  Location: [address] [place] [address]   Infection Prevention Recommendations for Individuals Confirmed to have, or Being Evaluated for, 2019 Novel Coronavirus (COVID-19) Infection Who Receive Care at Home  Individuals who are confirmed to have, or are being evaluated for, COVID-19 should follow the prevention steps below until a healthcare provider or local or state health department says they can return to normal activities.  Stay home except to get medical care You should restrict activities outside your home, except for getting medical care. Do not go to work, school, or public areas, and do not use public transportation or taxis.  Call ahead before visiting your doctor Before your medical appointment, call the healthcare provider and tell them that you have, or are being evaluated for, COVID-19 infection. This will help the healthcare providers office take steps to keep other people from getting infected. Ask your healthcare provider to call the local or state health department.  Monitor your symptoms Seek prompt medical attention if your illness is worsening (e.g., difficulty breathing). Before going to your medical appointment, call the healthcare provider and tell them that you have, or are being evaluated for, COVID-19 infection. Ask your healthcare provider to call the local or state health department.  Wear a facemask You should wear a facemask that covers your nose and mouth when you are in the same room with other people and when you visit a healthcare provider. People who live with or visit you should also wear a facemask while they are in the same room with you.  Separate yourself from other people in your home As much as possible, you should stay in a different room from other people in your home. Also, you should use a  separate bathroom, if available.  Avoid sharing household items You should not share dishes, drinking glasses, cups, eating utensils, towels, bedding, or other items with other people in your home. After using these items, you should wash them thoroughly with soap and water.  Cover your coughs and sneezes Cover your mouth and nose with a tissue when you cough or sneeze, or you can cough or sneeze into your sleeve. Throw used tissues in a lined trash can, and immediately wash your hands with soap and water for at least 20 seconds or use an alcohol-based hand rub.  Wash your [employer] your hands often and thoroughly with soap and water for at least 20 seconds. You can use an alcohol-based hand sanitizer if soap and water are not available and if your hands are not visibly dirty. Avoid touching your eyes, nose, and mouth with unwashed hands.   Prevention Steps for Caregivers and Household Members of Individuals Confirmed to have, or Being Evaluated for, COVID-19 Infection Being Cared for in the Home  If you live with, or provide care at home for, a person confirmed to have, or being evaluated for, COVID-19 infection please follow these guidelines to prevent infection:  Follow healthcare providers instructions Make sure that you understand and can help the patient follow any healthcare provider instructions for all care.  Provide for the patients basic needs You should help the patient with basic needs in the home and provide support for getting groceries, prescriptions, and other personal needs.  Monitor the patients symptoms If they are getting sicker, call his or her medical provider and tell them that the  patient has, or is being evaluated for, COVID-19 infection. This will help the healthcare providers office take steps to keep other people from getting infected. Ask the healthcare provider to call the local or state health department.  Limit the number of people who have  contact with the patient If possible, have only one caregiver for the patient. Other household members should stay in another home or place of residence. If this is not possible, they should stay in another room, or be separated from the patient as much as possible. Use a separate bathroom, if available. Restrict visitors who do not have an essential need to be in the home.  Keep older adults, very young children, and other sick people away from the patient Keep older adults, very young children, and those who have compromised immune systems or chronic health conditions away from the patient. This includes people with chronic heart, lung, or kidney conditions, diabetes, and cancer.  Ensure good ventilation Make sure that shared spaces in the home have good air flow, such as from an air conditioner or an opened window, weather permitting.  Wash your hands often Wash your hands often and thoroughly with soap and water for at least 20 seconds. You can use an alcohol based hand sanitizer if soap and water are not available and if your hands are not visibly dirty. Avoid touching your eyes, nose, and mouth with unwashed hands. Use disposable paper towels to dry your hands. If not available, use dedicated cloth towels and replace them when they become wet.  Wear a facemask and gloves Wear a disposable facemask at all times in the room and gloves when you touch or have contact with the patients blood, body fluids, and/or secretions or excretions, such as sweat, saliva, sputum, nasal mucus, vomit, urine, or feces.  Ensure the mask fits over your nose and mouth tightly, and do not touch it during use. Throw out disposable facemasks and gloves after using them. Do not reuse. Wash your hands immediately after removing your facemask and gloves. If your personal clothing becomes contaminated, carefully remove clothing and launder. Wash your hands after handling contaminated clothing. Place all used  disposable facemasks, gloves, and other waste in a lined container before disposing them with other household waste. Remove gloves and wash your hands immediately after handling these items.  Do not share dishes, glasses, or other household items with the patient Avoid sharing household items. You should not share dishes, drinking glasses, cups, eating utensils, towels, bedding, or other items with a patient who is confirmed to have, or being evaluated for, COVID-19 infection. After the person uses these items, you should wash them thoroughly with soap and water.  Wash laundry thoroughly Immediately remove and wash clothes or bedding that have blood, body fluids, and/or secretions or excretions, such as sweat, saliva, sputum, nasal mucus, vomit, urine, or feces, on them. Wear gloves when handling laundry from the patient. Read and follow directions on labels of laundry or clothing items and detergent. In general, wash and dry with the warmest temperatures recommended on the label.  Clean all areas the individual has used often Clean all touchable surfaces, such as counters, tabletops, doorknobs, bathroom fixtures, toilets, phones, keyboards, tablets, and bedside tables, every day. Also, clean any surfaces that may have blood, body fluids, and/or secretions or excretions on them. Wear gloves when cleaning surfaces the patient has come in contact with. Use a diluted bleach solution (e.g., dilute bleach with 1 part bleach and 10 parts water)  or a household disinfectant with a label that says EPA-registered for coronaviruses. To make a bleach solution at home, add 1 tablespoon of bleach to 1 quart (4 cups) of water. For a larger supply, add  cup of bleach to 1 gallon (16 cups) of water. Read labels of cleaning products and follow recommendations provided on product labels. Labels contain instructions for safe and effective use of the cleaning product including precautions you should take when applying  the product, such as wearing gloves or eye protection and making sure you have good ventilation during use of the product. Remove gloves and wash hands immediately after cleaning.  Monitor yourself for signs and symptoms of illness Caregivers and household members are considered close contacts, should monitor their health, and will be asked to limit movement outside of the home to the extent possible. Follow the monitoring steps for close contacts listed on the symptom monitoring form.   ? If you have additional questions, contact your local health department or call the epidemiologist on call at [phone] (available 24/7). ? This guidance is subject to change. For the most up-to-date guidance from [facility], please refer to their website: TripMetro.huhttps://www.cdc.gov/coronavirus/2019-ncov/hcp/guidance-prevent-spread.html

## 2019-03-10 NOTE — ED Provider Notes (Signed)
[facility]    CSN: [id number] Arrival date & time: 03/10/19  1748      History   Chief Complaint Chief Complaint  Patient presents with  . Dizziness    HPI Dale Evans is a 28 y.o. male.   HPI  Patient is here for illness.  He has been sick for several days.  He has vague symptoms of body aches, weakness, fatigue, and a sensation that he is going to pass out when he stands up for a long time.  It is not vertigo.  He does not feel like it is fainting, it more just lightheadedness.  He has a decreased appetite.  Lost taste for food.  Sense of smell feels reduced.  No known exposure to COVID-19.  No coughing shortness of breath.  Mild sore throat.  He had diarrhea for couple days last week.  Uncertain if it is related.  History reviewed. No pertinent past medical history.  There are no active problems to display for this patient.   Past Surgical History:  Procedure Laterality Date  . KNEE SURGERY    . TONSILLECTOMY         Home Medications    Prior to Admission medications   Not on File    Family History Family History  Problem Relation Age of Onset  . Healthy Mother   . Cancer Mother   . Hypertension Mother   . Healthy Father     Social History Social History   Tobacco Use  . Smoking status: Former Games developer  . Smokeless tobacco: Never Used  Substance Use Topics  . Alcohol use: No  . Drug use: No     Allergies   Licorice flavor [artificial licorice flavor] and Amoxicillin   Review of Systems Review of Systems  Constitutional: Positive for appetite change and fatigue. Negative for chills and fever.  HENT: Negative for dental problem, ear pain and sore throat.   Eyes: Negative for pain and visual disturbance.  Respiratory: Negative for cough and shortness of breath.   Cardiovascular: Negative for chest pain and palpitations.  Gastrointestinal: Negative for abdominal pain and vomiting.  Genitourinary: Negative for dysuria and  hematuria.  Musculoskeletal: Positive for myalgias. Negative for arthralgias and back pain.  Skin: Negative for color change and rash.  Neurological: Positive for weakness and light-headedness. Negative for seizures and syncope.  All other systems reviewed and are negative.    Physical Exam Triage Vital Signs ED Triage Vitals  Enc Vitals Group     BP 03/10/19 1821 133/84     Pulse Rate 03/10/19 1821 66     Resp 03/10/19 1821 16     Temp 03/10/19 1821 98.4 F (36.9 C)     Temp Source 03/10/19 1821 Oral     SpO2 03/10/19 1821 98 %     Weight --      Height --      Head Circumference --      Peak Flow --      Pain Score 03/10/19 1818 10     Pain Loc --      Pain Edu? --      Excl. in GC? --    No data found.  Updated Vital Signs BP 133/84 (BP Location: Right Arm) Comment (BP Location): large cuff  Pulse 66   Temp 98.4 F (36.9 C) (Oral)   Resp 16   SpO2 98%    Physical Exam Constitutional:      General: He is  not in acute distress.    Appearance: He is well-developed and normal weight.     Comments: Appears tired  HENT:     Head: Normocephalic and atraumatic.     Right Ear: Tympanic membrane and ear canal normal.     Left Ear: Tympanic membrane and ear canal normal.     Nose: No congestion.     Mouth/Throat:     Pharynx: Posterior oropharyngeal erythema present.  Eyes:     Conjunctiva/sclera: Conjunctivae normal.     Pupils: Pupils are equal, round, and reactive to light.  Neck:     Musculoskeletal: Normal range of motion.  Cardiovascular:     Rate and Rhythm: Normal rate and regular rhythm.     Heart sounds: Normal heart sounds.  Pulmonary:     Effort: Pulmonary effort is normal. No respiratory distress.     Breath sounds: Normal breath sounds. No rales.  Abdominal:     General: There is no distension.     Palpations: Abdomen is soft.  Musculoskeletal: Normal range of motion.  Lymphadenopathy:     Cervical: No cervical adenopathy.  Skin:    General:  Skin is warm and dry.  Neurological:     General: No focal deficit present.     Mental Status: He is alert.     Motor: No weakness.     Coordination: Coordination normal.     Gait: Gait normal.     Deep Tendon Reflexes: Reflexes normal.  Psychiatric:        Behavior: Behavior normal.      UC Treatments / Results  Labs (all labs ordered are listed, but only abnormal results are displayed) Labs Reviewed - No data to display  EKG None  Radiology No results found.  Procedures Procedures (including critical care time)  Medications Ordered in UC Medications - No data to display  Initial Impression / Assessment and Plan / UC Course  I have reviewed the triage vital signs and the nursing notes.  Pertinent labs & imaging results that were available during my care of the patient were reviewed by me and considered in my medical decision making (see chart for details).    Patient has a vague viral symptoms.  With the COVID-19 pandemic I feel it is important to test him for COVID-19.  We had a discussion regarding the importance of quarantine at home until his test result is available. Final Clinical Impressions(s) / UC Diagnoses   Final diagnoses:  Lightheadedness  Fatigue, unspecified type  Suspected Covid-19 Virus Infection     Discharge Instructions     Home to rest Must quarantine until results are in Tylenol for pain and fever     Person Under Monitoring Name: [name]  [place] [address] [place] [address]   Infection Prevention Recommendations for Individuals Confirmed to have, or Being Evaluated for, 2019 Novel Coronavirus (COVID-19) Infection Who Receive Care at Home  Individuals who are confirmed to have, or are being evaluated for, COVID-19 should follow the prevention steps below until a healthcare provider or local or state health department says they can return to normal activities.  Stay home except to get medical care You  should restrict activities outside your home, except for getting medical care. Do not go to work, school, or public areas, and do not use public transportation or taxis.  Call ahead before visiting your doctor Before your medical appointment, call the healthcare provider and tell them that you have, or are  being evaluated for, COVID-19 infection. This will help the healthcare provider's office take steps to keep other people from getting infected. Ask your healthcare provider to call the local or state health department.  Monitor your symptoms Seek prompt medical attention if your illness is worsening (e.g., difficulty breathing). Before going to your medical appointment, call the healthcare provider and tell them that you have, or are being evaluated for, COVID-19 infection. Ask your healthcare provider to call the local or state health department.  Wear a facemask You should wear a facemask that covers your nose and mouth when you are in the same room with other people and when you visit a healthcare provider. People who live with or visit you should also wear a facemask while they are in the same room with you.  Separate yourself from other people in your home As much as possible, you should stay in a different room from other people in your home. Also, you should use a separate bathroom, if available.  Avoid sharing household items You should not share dishes, drinking glasses, cups, eating utensils, towels, bedding, or other items with other people in your home. After using these items, you should wash them thoroughly with soap and water.  Cover your coughs and sneezes Cover your mouth and nose with a tissue when you cough or sneeze, or you can cough or sneeze into your sleeve. Throw used tissues in a lined trash can, and immediately wash your hands with soap and water for at least 20 seconds or use an alcohol-based hand rub.  Wash your [employer] Wash your hands often and thoroughly  with soap and water for at least 20 seconds. You can use an alcohol-based hand sanitizer if soap and water are not available and if your hands are not visibly dirty. Avoid touching your eyes, nose, and mouth with unwashed hands.   Prevention Steps for Caregivers and Household Members of Individuals Confirmed to have, or Being Evaluated for, COVID-19 Infection Being Cared for in the Home  If you live with, or provide care at home for, a person confirmed to have, or being evaluated for, COVID-19 infection please follow these guidelines to prevent infection:  Follow healthcare provider's instructions Make sure that you understand and can help the patient follow any healthcare provider instructions for all care.  Provide for the patient's basic needs You should help the patient with basic needs in the home and provide support for getting groceries, prescriptions, and other personal needs.  Monitor the patient's symptoms If they are getting sicker, call his or her medical provider and tell them that the patient has, or is being evaluated for, COVID-19 infection. This will help the healthcare provider's office take steps to keep other people from getting infected. Ask the healthcare provider to call the local or state health department.  Limit the number of people who have contact with the patient  If possible, have only one caregiver for the patient.  Other household members should stay in another home or place of residence. If this is not possible, they should stay  in another room, or be separated from the patient as much as possible. Use a separate bathroom, if available.  Restrict visitors who do not have an essential need to be in the home.  Keep older adults, very young children, and other sick people away from the patient Keep older adults, very young children, and those who have compromised immune systems or chronic health conditions away from the patient. This includes  people  with chronic heart, lung, or kidney conditions, diabetes, and cancer.  Ensure good ventilation Make sure that shared spaces in the home have good air flow, such as from an air conditioner or an opened window, weather permitting.  Wash your hands often  Wash your hands often and thoroughly with soap and water for at least 20 seconds. You can use an alcohol based hand sanitizer if soap and water are not available and if your hands are not visibly dirty.  Avoid touching your eyes, nose, and mouth with unwashed hands.  Use disposable paper towels to dry your hands. If not available, use dedicated cloth towels and replace them when they become wet.  Wear a facemask and gloves  Wear a disposable facemask at all times in the room and gloves when you touch or have contact with the patient's blood, body fluids, and/or secretions or excretions, such as sweat, saliva, sputum, nasal mucus, vomit, urine, or feces.  Ensure the mask fits over your nose and mouth tightly, and do not touch it during use.  Throw out disposable facemasks and gloves after using them. Do not reuse.  Wash your hands immediately after removing your facemask and gloves.  If your personal clothing becomes contaminated, carefully remove clothing and launder. Wash your hands after handling contaminated clothing.  Place all used disposable facemasks, gloves, and other waste in a lined container before disposing them with other household waste.  Remove gloves and wash your hands immediately after handling these items.  Do not share dishes, glasses, or other household items with the patient  Avoid sharing household items. You should not share dishes, drinking glasses, cups, eating utensils, towels, bedding, or other items with a patient who is confirmed to have, or being evaluated for, COVID-19 infection.  After the person uses these items, you should wash them thoroughly with soap and water.  Wash laundry thoroughly   Immediately remove and wash clothes or bedding that have blood, body fluids, and/or secretions or excretions, such as sweat, saliva, sputum, nasal mucus, vomit, urine, or feces, on them.  Wear gloves when handling laundry from the patient.  Read and follow directions on labels of laundry or clothing items and detergent. In general, wash and dry with the warmest temperatures recommended on the label.  Clean all areas the individual has used often  Clean all touchable surfaces, such as counters, tabletops, doorknobs, bathroom fixtures, toilets, phones, keyboards, tablets, and bedside tables, every day. Also, clean any surfaces that may have blood, body fluids, and/or secretions or excretions on them.  Wear gloves when cleaning surfaces the patient has come in contact with.  Use a diluted bleach solution (e.g., dilute bleach with 1 part bleach and 10 parts water) or a household disinfectant with a label that says EPA-registered for coronaviruses. To make a bleach solution at home, add 1 tablespoon of bleach to 1 quart (4 cups) of water. For a larger supply, add  cup of bleach to 1 gallon (16 cups) of water.  Read labels of cleaning products and follow recommendations provided on product labels. Labels contain instructions for safe and effective use of the cleaning product including precautions you should take when applying the product, such as wearing gloves or eye protection and making sure you have good ventilation during use of the product.  Remove gloves and wash hands immediately after cleaning.  Monitor yourself for signs and symptoms of illness Caregivers and household members are considered close contacts, should monitor their health, and will  be asked to limit movement outside of the home to the extent possible. Follow the monitoring steps for close contacts listed on the symptom monitoring form.   ? If you have additional questions, contact your local health department or call the  epidemiologist on call at [phone] (available 24/7). ? This guidance is subject to change. For the most up-to-date guidance from [facility], please refer to their website: YouBlogs.pl     ED Prescriptions    None     Controlled Substance Prescriptions [place] Controlled Substance Registry consulted? Not Applicable   Raylene Everts, MD 03/10/19 2039

## 2019-03-11 ENCOUNTER — Telehealth: Payer: Self-pay | Admitting: *Deleted

## 2019-03-11 ENCOUNTER — Other Ambulatory Visit: Payer: No Typology Code available for payment source

## 2019-03-11 DIAGNOSIS — Z20822 Contact with and (suspected) exposure to covid-19: Secondary | ICD-10-CM

## 2019-03-11 NOTE — Telephone Encounter (Signed)
-----   Message from Raylene Everts, MD sent at 03/10/2019  6:43 PM EDT ----- Regarding: covid testing needed

## 2019-03-11 NOTE — Telephone Encounter (Signed)
Spoke with patient.  Scheduled him for COVID 19 test today at 2:30 pm at [facility].  Testing protocol reviewed with patient.

## 2019-03-15 ENCOUNTER — Emergency Department (HOSPITAL_COMMUNITY)
Admission: EM | Admit: 2019-03-15 | Discharge: 2019-03-16 | Disposition: A | Discharge status: Home / Self Care | Payer: No Typology Code available for payment source | Attending: Emergency Medicine | Admitting: Emergency Medicine

## 2019-03-15 ENCOUNTER — Other Ambulatory Visit: Payer: Self-pay

## 2019-03-15 ENCOUNTER — Encounter (HOSPITAL_COMMUNITY): Payer: Self-pay | Admitting: Emergency Medicine

## 2019-03-15 ENCOUNTER — Emergency Department (HOSPITAL_COMMUNITY): Payer: No Typology Code available for payment source

## 2019-03-15 DIAGNOSIS — B349 Viral infection, unspecified: Secondary | ICD-10-CM

## 2019-03-15 DIAGNOSIS — Z87891 Personal history of nicotine dependence: Secondary | ICD-10-CM | POA: Diagnosis not present

## 2019-03-15 DIAGNOSIS — R531 Weakness: Secondary | ICD-10-CM | POA: Diagnosis present

## 2019-03-15 LAB — BASIC METABOLIC PANEL
Anion gap: 10 (ref 5–15)
BUN: 9 mg/dL (ref 6–20)
CO2: 26 mmol/L (ref 22–32)
Calcium: 9.4 mg/dL (ref 8.9–10.3)
Chloride: 101 mmol/L (ref 98–111)
Creatinine, Ser: 1.15 mg/dL (ref 0.61–1.24)
GFR calc Af Amer: >60 mL/min (ref >60)
GFR calc non Af Amer: >60 mL/min (ref >60)
Glucose, Bld: 96 mg/dL (ref 70–99)
Potassium: 3.7 mmol/L (ref 3.5–5.1)
Sodium: 137 mmol/L (ref 135–145)

## 2019-03-15 LAB — URINALYSIS, ROUTINE W REFLEX MICROSCOPIC
Bilirubin Urine: NEGATIVE
Glucose, UA: NEGATIVE mg/dL
Hgb urine dipstick: NEGATIVE
Ketones, ur: NEGATIVE mg/dL
Leukocytes,Ua: NEGATIVE
Nitrite: NEGATIVE
Protein, ur: NEGATIVE mg/dL
Specific Gravity, Urine: 1.018 (ref 1.005–1.030)
pH: 6 (ref 5.0–8.0)

## 2019-03-15 LAB — CBC
HCT: 38.4 % — ABNORMAL LOW (ref 39.0–52.0)
Hemoglobin: 13.2 g/dL (ref 13.0–17.0)
MCH: 28.9 pg (ref 26.0–34.0)
MCHC: 34.4 g/dL (ref 30.0–36.0)
MCV: 84 fL (ref 80.0–100.0)
Platelets: 203 10*3/uL (ref 150–400)
RBC: 4.57 MIL/uL (ref 4.22–5.81)
RDW: 12.2 % (ref 11.5–15.5)
WBC: 6.6 10*3/uL (ref 4.0–10.5)
nRBC: 0 % (ref 0.0–0.2)

## 2019-03-15 MED ORDER — KETOROLAC TROMETHAMINE 30 MG/ML IJ SOLN
30.0000 mg | Freq: Once | INTRAMUSCULAR | Status: AC
  Administered 2019-03-16: 30 mg via INTRAVENOUS
  Filled 2019-03-15: qty 1

## 2019-03-15 MED ORDER — SODIUM CHLORIDE 0.9% FLUSH: 3.0000 mL | Freq: Once | INTRAVENOUS | Status: DC

## 2019-03-15 MED ORDER — SODIUM CHLORIDE 0.9 % IV BOLUS
1000.0000 mL | Freq: Once | INTRAVENOUS | Status: AC
  Administered 2019-03-16: 1000 mL via INTRAVENOUS

## 2019-03-15 MED ORDER — LOPERAMIDE HCL 2 MG PO CAPS
4.0000 mg | ORAL_CAPSULE | Freq: Once | ORAL | Status: AC
  Administered 2019-03-16: 4 mg via ORAL
  Filled 2019-03-15: qty 2

## 2019-03-15 NOTE — ED Provider Notes (Signed)
[facility] Provider Note   CSN: [id number] Arrival date & time: 03/15/19  2109   History   Chief Complaint Chief Complaint  Patient presents with  . Weakness    HPI [facility] is a 28 y.o. male.   The history is provided by the patient.  Weakness He comes in because of generalized malaise.  He started getting sick 6 days ago when he loss of sense of smell and taste followed by fatigue, dizziness, body aches.  He was seen in urgent care on June 29 and was sent for outpatient COVID-19 testing, results not back yet.  Since then, he continues to have fatigue and dizziness and body aches.  He is also having diarrhea with 3-4 watery bowel movements a day.  He did have nausea and vomiting on one occasion 3 days ago, no longer having any nausea.  He denies sore throat or cough or dyspnea.  He did have exposure to a coworker who tested positive for COVID-19.  History reviewed. No pertinent past medical history.  There are no active problems to display for this patient.   Past Surgical History:  Procedure Laterality Date  . KNEE SURGERY    . TONSILLECTOMY          Home Medications    Prior to Admission medications   Not on File    Family History Family History  Problem Relation Age of Onset  . Healthy Mother   . Cancer Mother   . Hypertension Mother   . Healthy Father     Social History Social History   Tobacco Use  . Smoking status: Former Research scientist (life sciences)  . Smokeless tobacco: Never Used  Substance Use Topics  . Alcohol use: No  . Drug use: No     Allergies   Licorice flavor [artificial licorice flavor] and Amoxicillin   Review of Systems Review of Systems  Neurological: Positive for weakness.  All other systems reviewed and are negative.    Physical Exam Updated Vital Signs BP (!) 147/91   Pulse (!) 56   Temp 98 F (36.7 C) (Oral)   Resp 18   SpO2 100%   Physical Exam Vitals signs and nursing note reviewed.     28 year old male, resting comfortably and in no acute distress. Vital signs are significant for elevated blood pressure and borderline slow heart rate. Oxygen saturation is 100%, which is normal. Head is normocephalic and atraumatic. PERRLA, EOMI. Oropharynx is clear. Neck is nontender and supple without adenopathy or JVD. Back is nontender and there is no CVA tenderness. Lungs are clear without rales, wheezes, or rhonchi. Chest is nontender. Heart has regular rate and rhythm without murmur. Abdomen is soft, flat, nontender without masses or hepatosplenomegaly and peristalsis is normoactive. Extremities have no cyanosis or edema, full range of motion is present. Skin is warm and dry without rash. Neurologic: Mental status is normal, cranial nerves are intact, there are no motor or sensory deficits.  ED Treatments / Results  Labs (all labs ordered are listed, but only abnormal results are displayed) Labs Reviewed  CBC - Abnormal; Notable for the following components:      Result Value   HCT 38.4 (*)    All other components within normal limits  BASIC METABOLIC PANEL  URINALYSIS, ROUTINE W REFLEX MICROSCOPIC   Radiology No results found.  Procedures Procedures  Medications Ordered in ED Medications  sodium chloride flush (NS) 0.9 % injection 3 mL (has no administration  in time range)     Initial Impression / Assessment and Plan / ED Course  I have reviewed the triage vital signs and the nursing notes.  Pertinent labs & imaging results that were available during my care of the patient were reviewed by me and considered in my medical decision making (see chart for details).  Malaise, dizziness, body aches consistent with a COVID-19.  No evidence of severe disease currently.  CBC and basic metabolic panel are normal and urinalysis is normal.  Will check hepatic function studies and chest x-ray.  He will be given IV fluids and ketorolac as well as a dose of loperamide.  Old  records are reviewed confirming recent urgent care visit for similar symptoms.  Hepatic function studies are normal and chest x-ray is normal.  He feels somewhat better after above-noted treatment.  He is discharged with instructions to continue symptomatic treatment at home.  He will need to quarantine until he is symptom-free for 3 days.  Given work note to be out for the next 8 days.  [facility] was evaluated in Emergency Department on 03/15/2019 for the symptoms described in the history of present illness. He was evaluated in the context of the global COVID-19 pandemic, which necessitated consideration that the patient might be at risk for infection with the SARS-CoV-2 virus that causes COVID-19. Institutional protocols and algorithms that pertain to the evaluation of patients at risk for COVID-19 are in a state of rapid change based on information released by regulatory bodies including the CDC and federal and state organizations. These policies and algorithms were followed during the patient's care in the ED.  Final Clinical Impressions(s) / ED Diagnoses   Final diagnoses:  Viral syndrome    ED Discharge Orders    None       Dione BoozeGlick, Konner Warrior, MD 03/16/19 0210

## 2019-03-15 NOTE — ED Triage Notes (Signed)
Pt reports dizziness, weakness, and fatigue x 1 week. States he was tested for COVID, but has not gotten the result. Denies shortness of breath, cough, chest pain.

## 2019-03-16 LAB — HEPATIC FUNCTION PANEL
ALT: 22 U/L (ref 0–44)
AST: 23 U/L (ref 15–41)
Albumin: 4.3 g/dL (ref 3.5–5.0)
Alkaline Phosphatase: 52 U/L (ref 38–126)
Bilirubin, Direct: <0.1 mg/dL (ref 0.0–0.2)
Total Bilirubin: 0.7 mg/dL (ref 0.3–1.2)
Total Protein: 7.5 g/dL (ref 6.5–8.1)

## 2019-03-16 NOTE — ED Notes (Signed)
Med given 

## 2019-03-16 NOTE — ED Notes (Signed)
Pt ill for one week    C/o weakness some generalized body aches  No temp

## 2019-03-16 NOTE — Discharge Instructions (Signed)
Drink plenty of fluids.  Take acetaminophen or ibuprofen for aching or fever.  Return to the Emergency Department if you are getting short of breath or confused.

## 2019-03-16 NOTE — ED Notes (Signed)
Pt fels some better

## 2019-03-17 LAB — NOVEL CORONAVIRUS, NAA: SARS-CoV-2, NAA: NOT DETECTED

## 2019-03-19 ENCOUNTER — Encounter (HOSPITAL_COMMUNITY): Payer: Self-pay | Admitting: *Deleted

## 2019-03-19 ENCOUNTER — Emergency Department (HOSPITAL_COMMUNITY)
Admission: EM | Admit: 2019-03-19 | Discharge: 2019-03-20 | Disposition: A | Discharge status: Home / Self Care | Payer: No Typology Code available for payment source | Source: Home / Self Care | Attending: Emergency Medicine | Admitting: Emergency Medicine

## 2019-03-19 ENCOUNTER — Emergency Department (HOSPITAL_COMMUNITY)
Admission: EM | Admit: 2019-03-19 | Discharge: 2019-03-19 | Discharge status: Against Medical Advice | Payer: No Typology Code available for payment source | Attending: Emergency Medicine | Admitting: Emergency Medicine

## 2019-03-19 ENCOUNTER — Emergency Department (HOSPITAL_COMMUNITY): Payer: No Typology Code available for payment source

## 2019-03-19 ENCOUNTER — Other Ambulatory Visit: Payer: Self-pay

## 2019-03-19 ENCOUNTER — Ambulatory Visit: Payer: Self-pay | Admitting: *Deleted

## 2019-03-19 DIAGNOSIS — Z5320 Procedure and treatment not carried out because of patient's decision for unspecified reasons: Secondary | ICD-10-CM | POA: Insufficient documentation

## 2019-03-19 DIAGNOSIS — Z87891 Personal history of nicotine dependence: Secondary | ICD-10-CM | POA: Diagnosis not present

## 2019-03-19 DIAGNOSIS — B349 Viral infection, unspecified: Secondary | ICD-10-CM

## 2019-03-19 DIAGNOSIS — M7918 Myalgia, other site: Secondary | ICD-10-CM | POA: Insufficient documentation

## 2019-03-19 DIAGNOSIS — R42 Dizziness and giddiness: Secondary | ICD-10-CM | POA: Diagnosis not present

## 2019-03-19 DIAGNOSIS — R0781 Pleurodynia: Secondary | ICD-10-CM

## 2019-03-19 DIAGNOSIS — Z20828 Contact with and (suspected) exposure to other viral communicable diseases: Secondary | ICD-10-CM | POA: Insufficient documentation

## 2019-03-19 DIAGNOSIS — M791 Myalgia, unspecified site: Secondary | ICD-10-CM

## 2019-03-19 DIAGNOSIS — R0602 Shortness of breath: Secondary | ICD-10-CM | POA: Insufficient documentation

## 2019-03-19 DIAGNOSIS — R072 Precordial pain: Secondary | ICD-10-CM | POA: Diagnosis not present

## 2019-03-19 LAB — BASIC METABOLIC PANEL
Anion gap: 12 (ref 5–15)
BUN: 11 mg/dL (ref 6–20)
CO2: 26 mmol/L (ref 22–32)
Calcium: 9.9 mg/dL (ref 8.9–10.3)
Chloride: 100 mmol/L (ref 98–111)
Creatinine, Ser: 1.27 mg/dL — ABNORMAL HIGH (ref 0.61–1.24)
GFR calc Af Amer: >60 mL/min (ref >60)
GFR calc non Af Amer: >60 mL/min (ref >60)
Glucose, Bld: 119 mg/dL — ABNORMAL HIGH (ref 70–99)
Potassium: 3.2 mmol/L — ABNORMAL LOW (ref 3.5–5.1)
Sodium: 138 mmol/L (ref 135–145)

## 2019-03-19 LAB — CBC
HCT: 42.2 % (ref 39.0–52.0)
Hemoglobin: 14.5 g/dL (ref 13.0–17.0)
MCH: 28.6 pg (ref 26.0–34.0)
MCHC: 34.4 g/dL (ref 30.0–36.0)
MCV: 83.2 fL (ref 80.0–100.0)
Platelets: 232 10*3/uL (ref 150–400)
RBC: 5.07 MIL/uL (ref 4.22–5.81)
RDW: 12.1 % (ref 11.5–15.5)
WBC: 7.7 10*3/uL (ref 4.0–10.5)
nRBC: 0 % (ref 0.0–0.2)

## 2019-03-19 LAB — TROPONIN I (HIGH SENSITIVITY): Troponin I (High Sensitivity): 8 ng/L (ref <18)

## 2019-03-19 MED ORDER — SODIUM CHLORIDE 0.9% FLUSH: 3.0000 mL | Freq: Once | INTRAVENOUS | Status: DC

## 2019-03-19 MED ORDER — SODIUM CHLORIDE 0.9 % IV SOLN
1000.0000 mL | INTRAVENOUS | Status: DC
  Administered 2019-03-20: 1000 mL via INTRAVENOUS

## 2019-03-19 NOTE — ED Notes (Signed)
Pt stated he is feeling better and does not want to wait. Tried to encourage Pt to stay b/c he has stated he has been having issues with his Blood Pressure. Pt still states he wants his IV so that he can leave. IV removed and Pt has left.

## 2019-03-19 NOTE — Telephone Encounter (Signed)
Patient was tested for COVID-19 on March 11, 2019 and the results were negative. He went to the emergency room on 03/15/2019 with symptoms of not being able to smell anything, vomiting, chest pain, could not breath, headache, body aches, fever, loss of appetite and dizziness. The provider on duty told him he thought he had COVID-19 but he wasn't tested again for COVID. He does not have a PCP. He would like to know what to do at this point.    Patient has been out of work since 6/29- he can not get paid unless he has COVID testing that is positive or his letter from the hospital states COVID as the reason he is out of work. Patient still has taste and smell problems and has weakness.  Patient needs his letter revised because the letter from the hospital is not sufficient. Patient informed that since we did not write the letter- then we could not change it- he would need to contact the hospital for revision. He will let them know and see if they can help him. Patient states his symptoms are improving- and he is doing better. He will call back if he feels he needs further help.

## 2019-03-19 NOTE — ED Triage Notes (Signed)
Pt was tested for Covid on 6/31 and he was negative. He visited the ED on 7/4 for continued symptoms.  Pt states today he wants to be tested for Covid to go back to work, but his chest hurts, he dizzy, exhausted, has body aches,vomiting and has diarrhea.

## 2019-03-19 NOTE — ED Notes (Signed)
Pt has been waiting room since Triaged. When called for Vitals Pt was asleep so did not hear name being called. Had RN take Pt out after not hearing from Pt several times. Currently Updating Vitals on Pt.

## 2019-03-20 ENCOUNTER — Emergency Department (HOSPITAL_COMMUNITY): Payer: No Typology Code available for payment source

## 2019-03-20 LAB — CBC WITH DIFFERENTIAL/PLATELET
Abs Immature Granulocytes: 0.03 10*3/uL (ref 0.00–0.07)
Basophils Absolute: 0 10*3/uL (ref 0.0–0.1)
Basophils Relative: 0 %
Eosinophils Absolute: 0.2 10*3/uL (ref 0.0–0.5)
Eosinophils Relative: 2 %
HCT: 41.5 % (ref 39.0–52.0)
Hemoglobin: 14.2 g/dL (ref 13.0–17.0)
Immature Granulocytes: 0 %
Lymphocytes Relative: 38 %
Lymphs Abs: 3.5 10*3/uL (ref 0.7–4.0)
MCH: 28.7 pg (ref 26.0–34.0)
MCHC: 34.2 g/dL (ref 30.0–36.0)
MCV: 84 fL (ref 80.0–100.0)
Monocytes Absolute: 0.6 10*3/uL (ref 0.1–1.0)
Monocytes Relative: 6 %
Neutro Abs: 4.9 10*3/uL (ref 1.7–7.7)
Neutrophils Relative %: 54 %
Platelets: 229 10*3/uL (ref 150–400)
RBC: 4.94 MIL/uL (ref 4.22–5.81)
RDW: 12.3 % (ref 11.5–15.5)
WBC: 9.2 10*3/uL (ref 4.0–10.5)
nRBC: 0 % (ref 0.0–0.2)

## 2019-03-20 LAB — COMPREHENSIVE METABOLIC PANEL
ALT: 20 U/L (ref 0–44)
AST: 20 U/L (ref 15–41)
Albumin: 4.8 g/dL (ref 3.5–5.0)
Alkaline Phosphatase: 59 U/L (ref 38–126)
Anion gap: 13 (ref 5–15)
BUN: 11 mg/dL (ref 6–20)
CO2: 26 mmol/L (ref 22–32)
Calcium: 9.8 mg/dL (ref 8.9–10.3)
Chloride: 100 mmol/L (ref 98–111)
Creatinine, Ser: 1.36 mg/dL — ABNORMAL HIGH (ref 0.61–1.24)
GFR calc Af Amer: >60 mL/min (ref >60)
GFR calc non Af Amer: >60 mL/min (ref >60)
Glucose, Bld: 93 mg/dL (ref 70–99)
Potassium: 3.7 mmol/L (ref 3.5–5.1)
Sodium: 139 mmol/L (ref 135–145)
Total Bilirubin: 0.7 mg/dL (ref 0.3–1.2)
Total Protein: 8.5 g/dL — ABNORMAL HIGH (ref 6.5–8.1)

## 2019-03-20 LAB — C-REACTIVE PROTEIN: CRP: <0.8 mg/dL (ref <1.0)

## 2019-03-20 LAB — FERRITIN: Ferritin: 199 ng/mL (ref 24–336)

## 2019-03-20 LAB — D-DIMER, QUANTITATIVE: D-Dimer, Quant: 0.32 ug/mL-FEU (ref 0.00–0.50)

## 2019-03-20 LAB — LACTIC ACID, PLASMA: Lactic Acid, Venous: 1.2 mmol/L (ref 0.5–1.9)

## 2019-03-20 LAB — SARS CORONAVIRUS 2 BY RT PCR (HOSPITAL ORDER, PERFORMED IN ~~LOC~~ HOSPITAL LAB): SARS Coronavirus 2: NEGATIVE

## 2019-03-20 LAB — FIBRINOGEN: Fibrinogen: 453 mg/dL (ref 210–475)

## 2019-03-20 LAB — LACTATE DEHYDROGENASE: LDH: 152 U/L (ref 98–192)

## 2019-03-20 LAB — PROCALCITONIN: Procalcitonin: <0.1 ng/mL

## 2019-03-20 MED ORDER — IBUPROFEN 600 MG PO TABS: 600.0000 mg | ORAL_TABLET | Freq: Four times a day (QID) | ORAL | 0 refills | Status: DC | PRN

## 2019-03-20 MED ORDER — IOHEXOL 350 MG/ML SOLN
75.0000 mL | Freq: Once | INTRAVENOUS | Status: AC | PRN
  Administered 2019-03-20: 03:00:00 75 mL via INTRAVENOUS

## 2019-03-20 NOTE — Discharge Instructions (Signed)
We saw you in the ER for weakness, body ache, chest pain, shortness of breath, dizziness and loss of taste. All the results in the ER are normal, labs and imaging. We have tested you for COVID-19 again, which is negative.  We also performed a CT scan of your lung, which does not reveal any evidence of blood clots in the lungs or signs of pneumonia.  We are not sure what is causing your symptoms, as all your lab results are normal as well.  The workup in the ER is not complete, and is limited to screening for life threatening and emergent conditions only, so please see a primary care doctor for further evaluation.

## 2019-03-20 NOTE — ED Notes (Signed)
Patient transported to CT 

## 2019-03-20 NOTE — ED Provider Notes (Signed)
[facility] EMERGENCY DEPARTMENT Provider Note   CSN: [id number] Arrival date & time: 03/19/19  2248     History   Chief Complaint No chief complaint on file.   HPI [facility] is a 28 y.o. male.     HPI  28 year old comes in a chief complaint of shortness of breath, body aches and dizziness.  Patient reports that he has been feeling sick for the last 1 week.  [facility], which turned out negative.  However his symptoms have persisted and he is started noticing some dizziness and near fainting.  He is also having pleuritic chest pain.  Review of system is also positive for loss of taste.  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  History reviewed. No pertinent past medical history.  There are no active problems to display for this patient.   Past Surgical History:  Procedure Laterality Date  . KNEE SURGERY    . TONSILLECTOMY          Home Medications    Prior to Admission medications   Not on File    Family History Family History  Problem Relation Age of Onset  . Healthy Mother   . Cancer Mother   . Hypertension Mother   . Healthy Father     Social History Social History   Tobacco Use  . Smoking status: Former Games developermoker  . Smokeless tobacco: Never Used  Substance Use Topics  . Alcohol use: No  . Drug use: No     Allergies   Licorice flavor [artificial licorice flavor] and Amoxicillin   Review of Systems Review of Systems   Physical Exam Updated Vital Signs BP (!) 141/87 (BP Location: Right Leg)   Pulse 70   Temp 98.3 F (36.8 C) (Oral)   Resp 16   SpO2 100%   Physical Exam Vitals signs and nursing note reviewed.  Constitutional:      Appearance: He is well-developed.  HENT:     Head: Normocephalic and atraumatic.  Eyes:     Conjunctiva/sclera: Conjunctivae normal.     Pupils: Pupils are equal, round, and  reactive to light.  Neck:     Musculoskeletal: Normal range of motion and neck supple.  Cardiovascular:     Rate and Rhythm: Normal rate and regular rhythm.  Pulmonary:     Effort: Pulmonary effort is normal.     Breath sounds: Normal breath sounds.  Abdominal:     General: Bowel sounds are normal.     Palpations: Abdomen is soft.     Tenderness: There is no abdominal tenderness.  Musculoskeletal:        General: No deformity.  Skin:    General: Skin is warm.  Neurological:     Mental Status: He is alert and oriented to person, place, and time.      ED Treatments / Results  Labs (all labs ordered are listed, but only abnormal results are displayed) Labs Reviewed  SARS CORONAVIRUS 2 (HOSPITAL ORDER, PERFORMED IN [facility])  CULTURE, BLOOD (ROUTINE X 2)  CULTURE, BLOOD (ROUTINE X 2)  LACTIC ACID, PLASMA  LACTIC ACID, PLASMA  CBC WITH DIFFERENTIAL/PLATELET  COMPREHENSIVE METABOLIC PANEL  D-DIMER, QUANTITATIVE (NOT AT [facility])  PROCALCITONIN  LACTATE DEHYDROGENASE  FERRITIN  TRIGLYCERIDES  FIBRINOGEN  C-REACTIVE PROTEIN    EKG None  Radiology Dg Chest 2 View  Result  Date: 03/19/2019 CLINICAL DATA:  Chest pain and shortness of breath EXAM: CHEST - 2 VIEW COMPARISON:  03/15/2019 FINDINGS: The heart size and mediastinal contours are within normal limits. Both lungs are clear. The visualized skeletal structures are unremarkable. IMPRESSION: No active cardiopulmonary disease. Electronically Signed   By: Inez Catalina M.D.   On: 03/19/2019 18:54    Procedures Procedures (including critical care time)  Medications Ordered in ED Medications  0.9 %  sodium chloride infusion (has no administration in time range)     Initial Impression / Assessment and Plan / ED Course  I have reviewed the triage vital signs and the nursing notes.  Pertinent labs & imaging results that were available during my care of the patient were reviewed by me and considered in my  medical decision making (see chart for details).        28 year old comes in a chief complaint of weakness, body aches, pleuritic chest pain, shortness of breath, dizziness and fainting.  He had a negative cover test earlier in the week, but states that he was exposed to multiple people with COVID-19 whilst at work.  Hemodynamically he is stable.  Although his COVID-19 test was negative, with him having anosmia but concerned that he actually is likely positive.  Additionally, because he is having pleuritic chest pain, with dizziness and syncope we will get a CT PE.    Final Clinical Impressions(s) / ED Diagnoses   Final diagnoses:  None    ED Discharge Orders    None       Varney Biles, MD 03/20/19 [phone]

## 2019-03-25 LAB — CULTURE, BLOOD (ROUTINE X 2)
Culture: NO GROWTH
Culture: NO GROWTH
Special Requests: ADEQUATE

## 2019-12-03 ENCOUNTER — Encounter (HOSPITAL_COMMUNITY): Payer: Self-pay | Admitting: Emergency Medicine

## 2019-12-03 ENCOUNTER — Other Ambulatory Visit: Payer: Self-pay

## 2019-12-03 ENCOUNTER — Emergency Department (HOSPITAL_COMMUNITY)
Admission: EM | Admit: 2019-12-03 | Discharge: 2019-12-04 | Disposition: A | Discharge status: Home / Self Care | Payer: Self-pay | Attending: Emergency Medicine | Admitting: Emergency Medicine

## 2019-12-03 ENCOUNTER — Emergency Department (HOSPITAL_COMMUNITY): Payer: Self-pay

## 2019-12-03 DIAGNOSIS — M79672 Pain in left foot: Secondary | ICD-10-CM | POA: Insufficient documentation

## 2019-12-03 DIAGNOSIS — Z87891 Personal history of nicotine dependence: Secondary | ICD-10-CM | POA: Insufficient documentation

## 2019-12-03 NOTE — ED Triage Notes (Signed)
Patient is complaining of left foot pain from stepping on glass figurine.

## 2019-12-04 ENCOUNTER — Encounter (HOSPITAL_COMMUNITY): Payer: Self-pay | Admitting: Emergency Medicine

## 2019-12-04 MED ORDER — IBUPROFEN 800 MG PO TABS
800.0000 mg | ORAL_TABLET | Freq: Once | ORAL | Status: AC
  Administered 2019-12-04: 800 mg via ORAL
  Filled 2019-12-04: qty 1

## 2019-12-04 MED ORDER — SULFAMETHOXAZOLE-TRIMETHOPRIM 800-160 MG PO TABS
1.0000 | ORAL_TABLET | Freq: Once | ORAL | Status: AC
  Administered 2019-12-04: 1 via ORAL
  Filled 2019-12-04: qty 1

## 2019-12-04 MED ORDER — SULFAMETHOXAZOLE-TRIMETHOPRIM 800-160 MG PO TABS: 1.0000 | ORAL_TABLET | Freq: Two times a day (BID) | ORAL | 0 refills | Status: AC

## 2019-12-04 NOTE — ED Provider Notes (Signed)
[facility]-EMERGENCY DEPT Provider Note   CSN: [id number] Arrival date & time: 12/03/19  2323     History Chief Complaint  Patient presents with  . Foot Injury    [facility] is a 29 y.o. male.  The history is provided by the patient.  Foot Injury Location:  Foot Time since incident:  18 hours Injury: yes   Mechanism of injury comment:  Glass in the foot  Foot location:  Sole of L foot Pain details:    Quality:  Aching   Radiates to:  Does not radiate   Severity:  Mild   Onset quality:  Sudden   Duration:  18 hours   Timing:  Constant   Progression:  Unchanged Chronicity:  New Dislocation: no   Foreign body present: states he thought he got it all out but is unsure  Tetanus status:  Up to date Prior injury to area:  No Relieved by:  Nothing Worsened by:  Nothing Ineffective treatments:  None tried Associated symptoms: no back pain and no fever   Risk factors: obesity   Risk factors: no concern for non-accidental trauma        History reviewed. No pertinent past medical history.  There are no problems to display for this patient.   Past Surgical History:  Procedure Laterality Date  . KNEE SURGERY    . TONSILLECTOMY         Family History  Problem Relation Age of Onset  . Healthy Mother   . Cancer Mother   . Hypertension Mother   . Healthy Father     Social History   Tobacco Use  . Smoking status: Former Games developer  . Smokeless tobacco: Never Used  Substance Use Topics  . Alcohol use: No  . Drug use: No    Home Medications Prior to Admission medications   Medication Sig Start Date End Date Taking? Authorizing Provider  ibuprofen (ADVIL) 600 MG tablet Take 1 tablet (600 mg total) by mouth every 6 (six) hours as needed. 03/20/19   Derwood Kaplan, MD    Allergies    Licorice flavor [artificial licorice flavor] and Amoxicillin  Review of Systems   Review of Systems  Constitutional: Negative for fever.  HENT:  Negative for congestion.   Eyes: Negative for visual disturbance.  Respiratory: Negative for shortness of breath.   Cardiovascular: Negative for chest pain.  Gastrointestinal: Negative for abdominal distention.  Musculoskeletal: Negative for back pain.  Skin: Negative for wound.  Neurological: Negative for dizziness.  Psychiatric/Behavioral: Negative for agitation.  All other systems reviewed and are negative.   Physical Exam Updated Vital Signs BP (!) 154/102 (BP Location: Right Arm)   Pulse 72   Temp 98.7 F (37.1 C) (Oral)   Resp 16   Ht 6\' 3"  (1.905 m)   Wt (!) 149.7 kg   SpO2 98%   BMI 41.25 kg/m   Physical Exam Vitals and nursing note reviewed.  Constitutional:      General: He is not in acute distress.    Appearance: Normal appearance.  HENT:     Head: Normocephalic and atraumatic.     Nose: Nose normal.  Eyes:     Conjunctiva/sclera: Conjunctivae normal.     Pupils: Pupils are equal, round, and reactive to light.  Cardiovascular:     Rate and Rhythm: Normal rate and regular rhythm.     Pulses: Normal pulses.     Heart sounds: Normal heart sounds.  Pulmonary:  Effort: Pulmonary effort is normal.     Breath sounds: Normal breath sounds.  Abdominal:     General: Abdomen is flat. Bowel sounds are normal.     Tenderness: There is no abdominal tenderness. There is no guarding.  Musculoskeletal:        General: Normal range of motion.     Cervical back: Normal range of motion and neck supple.     Left foot: Normal. Normal range of motion and normal capillary refill. No swelling, deformity, bunion, Charcot foot, foot drop, prominent metatarsal heads, laceration, tenderness, bony tenderness or crepitus. Normal pulse.     Comments: There is no wound or laceration on the sole of the foot where he states the glass went in   Skin:    General: Skin is warm and dry.     Capillary Refill: Capillary refill takes less than 2 seconds.  Neurological:     General: No  focal deficit present.     Mental Status: He is alert and oriented to person, place, and time.  Psychiatric:        Mood and Affect: Mood normal.        Behavior: Behavior normal.     ED Results / Procedures / Treatments   Labs (all labs ordered are listed, but only abnormal results are displayed) Labs Reviewed - No data to display  EKG None  Radiology DG Foot Complete Left  Result Date: 12/04/2019 CLINICAL DATA:  Glass in foot EXAM: LEFT FOOT - COMPLETE 3+ VIEW COMPARISON:  None. FINDINGS: There is no evidence of fracture or dislocation. No radiopaque foreign body. There is soft tissue swelling seen on the plantar surface. IMPRESSION: No acute osseous abnormality or radiopaque foreign body. Electronically Signed   By: Jonna Clark M.D.   On: 12/04/2019 00:01    Procedures Procedures (including critical care time)  Medications Ordered in ED Keflex Tylenol  ED Course  I have reviewed the triage vital signs and the nursing notes.  Pertinent labs & imaging results that were available during my care of the patient were reviewed by me and considered in my medical decision making (see chart for details).  Foot cleansed in the ED.    No wound on the foot on exam. no foreign body on Xray.  Tetanus is up to date. Will cover with antibiotics as he states he dug something out.  No FB on bedside US either.  Will have patient follow up with podiatry for ongoing care.    [facility] was evaluated in Emergency Department on 12/04/2019 for the symptoms described in the history of present illness. He was evaluated in the context of the global COVID-19 pandemic, which necessitated consideration that the patient might be at risk for infection with the SARS-CoV-2 virus that causes COVID-19. Institutional protocols and algorithms that pertain to the evaluation of patients at risk for COVID-19 are in a state of rapid change based on information released by regulatory bodies including the CDC and  federal and state organizations. These policies and algorithms were followed during the patient's care in the ED.  Final Clinical Impression(s) / ED Diagnoses Return for weakness, numbness, changes in vision or speech, fevers >100.4 unrelieved by medication, shortness of breath, intractable vomiting, or diarrhea, abdominal pain, Inability to tolerate liquids or food, cough, altered mental status or any concerns. No signs of systemic illness or infection. The patient is nontoxic-appearing on exam and vital signs are within normal limits.   I have reviewed the  triage vital signs and the nursing notes. Pertinent labs &imaging results that were available during my care of the patient were reviewed by me and considered in my medical decision making (see chart for details).  After history, exam, and medical workup I feel the patient has been appropriately medically screened and is safe for discharge home. Pertinent diagnoses were discussed with the patient. Patient was givenstrictreturn precautions.   Jayvien Rowlette, MD 12/04/19 1062

## 2020-01-04 ENCOUNTER — Emergency Department (HOSPITAL_COMMUNITY)
Admission: EM | Admit: 2020-01-04 | Discharge: 2020-01-05 | Disposition: A | Discharge status: Home / Self Care | Payer: Self-pay | Attending: Emergency Medicine | Admitting: Emergency Medicine

## 2020-01-04 ENCOUNTER — Other Ambulatory Visit: Payer: Self-pay

## 2020-01-04 ENCOUNTER — Emergency Department (HOSPITAL_COMMUNITY): Payer: Self-pay

## 2020-01-04 DIAGNOSIS — R0602 Shortness of breath: Secondary | ICD-10-CM | POA: Insufficient documentation

## 2020-01-04 DIAGNOSIS — R197 Diarrhea, unspecified: Secondary | ICD-10-CM | POA: Insufficient documentation

## 2020-01-04 DIAGNOSIS — H6123 Impacted cerumen, bilateral: Secondary | ICD-10-CM | POA: Insufficient documentation

## 2020-01-04 DIAGNOSIS — Z20822 Contact with and (suspected) exposure to covid-19: Secondary | ICD-10-CM | POA: Insufficient documentation

## 2020-01-04 DIAGNOSIS — R6883 Chills (without fever): Secondary | ICD-10-CM | POA: Insufficient documentation

## 2020-01-04 DIAGNOSIS — M7918 Myalgia, other site: Secondary | ICD-10-CM | POA: Insufficient documentation

## 2020-01-04 DIAGNOSIS — R42 Dizziness and giddiness: Secondary | ICD-10-CM | POA: Insufficient documentation

## 2020-01-04 LAB — COMPREHENSIVE METABOLIC PANEL
ALT: 21 U/L (ref 0–44)
AST: 18 U/L (ref 15–41)
Albumin: 4.2 g/dL (ref 3.5–5.0)
Alkaline Phosphatase: 61 U/L (ref 38–126)
Anion gap: 10 (ref 5–15)
BUN: 13 mg/dL (ref 6–20)
CO2: 26 mmol/L (ref 22–32)
Calcium: 9.4 mg/dL (ref 8.9–10.3)
Chloride: 104 mmol/L (ref 98–111)
Creatinine, Ser: 1.23 mg/dL (ref 0.61–1.24)
GFR calc Af Amer: >60 mL/min (ref >60)
GFR calc non Af Amer: >60 mL/min (ref >60)
Glucose, Bld: 87 mg/dL (ref 70–99)
Potassium: 3.9 mmol/L (ref 3.5–5.1)
Sodium: 140 mmol/L (ref 135–145)
Total Bilirubin: 0.6 mg/dL (ref 0.3–1.2)
Total Protein: 7.4 g/dL (ref 6.5–8.1)

## 2020-01-04 LAB — CBC
HCT: 42.3 % (ref 39.0–52.0)
Hemoglobin: 13.9 g/dL (ref 13.0–17.0)
MCH: 28.5 pg (ref 26.0–34.0)
MCHC: 32.9 g/dL (ref 30.0–36.0)
MCV: 86.9 fL (ref 80.0–100.0)
Platelets: 238 10*3/uL (ref 150–400)
RBC: 4.87 MIL/uL (ref 4.22–5.81)
RDW: 12.4 % (ref 11.5–15.5)
WBC: 7.4 10*3/uL (ref 4.0–10.5)
nRBC: 0 % (ref 0.0–0.2)

## 2020-01-04 LAB — DIFFERENTIAL
Abs Immature Granulocytes: 0.01 10*3/uL (ref 0.00–0.07)
Basophils Absolute: 0 10*3/uL (ref 0.0–0.1)
Basophils Relative: 0 %
Eosinophils Absolute: 0.3 10*3/uL (ref 0.0–0.5)
Eosinophils Relative: 3 %
Immature Granulocytes: 0 %
Lymphocytes Relative: 38 %
Lymphs Abs: 2.8 10*3/uL (ref 0.7–4.0)
Monocytes Absolute: 0.5 10*3/uL (ref 0.1–1.0)
Monocytes Relative: 6 %
Neutro Abs: 3.8 10*3/uL (ref 1.7–7.7)
Neutrophils Relative %: 53 %

## 2020-01-04 NOTE — ED Triage Notes (Signed)
Patient c/o dizziness and generalized body aches x2 days. Hx of vertigo per pt.

## 2020-01-05 ENCOUNTER — Emergency Department (HOSPITAL_COMMUNITY): Payer: Self-pay

## 2020-01-05 LAB — URINALYSIS, ROUTINE W REFLEX MICROSCOPIC
Bilirubin Urine: NEGATIVE
Glucose, UA: NEGATIVE mg/dL
Hgb urine dipstick: NEGATIVE
Ketones, ur: NEGATIVE mg/dL
Leukocytes,Ua: NEGATIVE
Nitrite: NEGATIVE
Protein, ur: NEGATIVE mg/dL
Specific Gravity, Urine: 1.027 (ref 1.005–1.030)
pH: 5 (ref 5.0–8.0)

## 2020-01-05 LAB — SARS CORONAVIRUS 2 (TAT 6-24 HRS): SARS Coronavirus 2: NEGATIVE

## 2020-01-05 MED ORDER — MECLIZINE HCL 25 MG PO TABS
50.0000 mg | ORAL_TABLET | Freq: Once | ORAL | Status: AC
  Administered 2020-01-05: 50 mg via ORAL
  Filled 2020-01-05: qty 2

## 2020-01-05 MED ORDER — MECLIZINE HCL 50 MG PO TABS
50.0000 mg | ORAL_TABLET | Freq: Three times a day (TID) | ORAL | 0 refills | Status: DC | PRN
Start: 2020-01-05 — End: 2022-06-02

## 2020-01-05 MED ORDER — DOCUSATE SODIUM 50 MG/5ML PO LIQD
50.0000 mg | Freq: Once | ORAL | Status: AC
  Administered 2020-01-05: 50 mg via OTIC
  Filled 2020-01-05: qty 10

## 2020-01-05 NOTE — ED Provider Notes (Addendum)
TIME SEEN: 12:46 AM  CHIEF COMPLAINT: Vertigo, "I felt out of it"  HPI: Patient is a 29 year old male with previous history of vertigo in 2014 who presents to the emergency department with feeling "out of it" today, feeling like the room is spinning whenever he is standing up, chills, body aches, shortness of breath, diarrhea.  No known Covid exposures or sick contacts.  He denies any known fevers, cough, nausea or vomiting, abdominal pain.  No numbness or focal weakness.  No head injury, headache.  Does have tinnitus for the past day but no ear pain or hearing loss.  ROS: See HPI Constitutional: no fever; + chills Eyes: no drainage  ENT: no runny nose   Cardiovascular:  no chest pain  Resp:  SOB  GI: no vomiting; + diarrhea GU: no dysuria Integumentary: no rash  Allergy: no hives  Musculoskeletal: no leg swelling  Neurological: no slurred speech; + vertigo ROS otherwise negative  PAST MEDICAL HISTORY/PAST SURGICAL HISTORY:  No past medical history on file.  MEDICATIONS:  Prior to Admission medications   Medication Sig Start Date End Date Taking? Authorizing Provider  ibuprofen (ADVIL) 600 MG tablet Take 1 tablet (600 mg total) by mouth every 6 (six) hours as needed. 03/20/19   Derwood Kaplan, MD    ALLERGIES:  Allergies  Allergen Reactions  . Licorice Flavor [Artificial Licorice Flavor] Anaphylaxis  . Amoxicillin Nausea And Vomiting and Swelling    Childhood allergy. Has patient had a PCN reaction causing immediate rash, facial/tongue/throat swelling, SOB or lightheadedness with hypotension: No Has patient had a PCN reaction causing severe rash involving mucus membranes or skin necrosis: No Has patient had a PCN reaction that required hospitalization No Has patient had a PCN reaction occurring within the last 10 years: No If all of the above answers are "NO", then may proceed with Cephalosporin use.     SOCIAL HISTORY:  Social History   Tobacco Use  . Smoking status:  Former Games developer  . Smokeless tobacco: Never Used  Substance Use Topics  . Alcohol use: No    FAMILY HISTORY: Family History  Problem Relation Age of Onset  . Healthy Mother   . Cancer Mother   . Hypertension Mother   . Healthy Father     EXAM: BP 135/79   Pulse 100   Temp 97.9 F (36.6 C)   Resp 18   SpO2 98%  CONSTITUTIONAL: Alert and oriented and responds appropriately to questions. Well-appearing; well-nourished, obese, afebrile, no distress, nontoxic HEAD: Normocephalic EYES: Conjunctivae clear, pupils appear equal, EOM appear intact ENT: normal nose; moist mucous membranes; patient has cerumen impaction bilaterally.  After irrigation TMs are clear bilaterally without erythema, purulence, bulging, perforation, effusion.  No residual cerumen impaction or sign of foreign body in the external auditory canal. No inflammation, erythema or drainage from the external auditory canal. No signs of mastoiditis. No pain with manipulation of the pinna bilaterally.  No pharyngeal erythema or petechiae, no tonsillar hypertrophy or exudate, no uvular deviation, no unilateral swelling, no trismus or drooling, no muffled voice, normal phonation, no stridor, no dental caries present, no drainable dental abscess noted, no Ludwig's angina, tongue sits flat in the bottom of the mouth, no angioedema, no facial erythema or warmth, no facial swelling; no pain with movement of the neck, no cervical LAD. NECK: Supple, normal ROM CARD: RRR; S1 and S2 appreciated; no murmurs, no clicks, no rubs, no gallops RESP: Normal chest excursion without splinting or tachypnea; breath sounds clear and  equal bilaterally; no wheezes, no rhonchi, no rales, no hypoxia or respiratory distress, speaking full sentences ABD/GI: Normal bowel sounds; non-distended; soft, non-tender, no rebound, no guarding, no peritoneal signs, no hepatosplenomegaly BACK:  The back appears normal EXT: Normal ROM in all joints; no deformity noted, no  edema; no cyanosis SKIN: Normal color for age and race; warm; no rash on exposed skin NEURO: Moves all extremities equally, normal sensation diffusely, cranial nerves II through XII intact, normal speech, gait testing deferred at this time, no nystagmus PSYCH: The patient's mood and manner are appropriate.   MEDICAL DECISION MAKING: Patient here with complaints of vertigo and feeling "out of it".  Neurologic exam intact here.  Labs reviewed/interpreted and show no acute abnormality.  Normal hemoglobin.  Normal electrolytes.  Head CT obtained in triage reviewed/interpreted and shows no acute abnormality.  I do not think that the patient is having a stroke.  He does have bilateral cerumen impaction on exam which could be contributing to his symptoms.  Will use Colace and irrigate both ears.  He has also complaining of chills, body aches, shortness of breath and diarrhea.  Discussed with patient that these are COVID-19 symptoms.  He agrees with testing today but denies any known sick contacts.  We will add on chest x-ray.  EKG reviewed/interpreted and shows no acute abnormality, ischemic changes, arrhythmia or interval abnormality.  ED PROGRESS: Ears irrigated and there is no residual cerumen.  TMs appear normal.  No sign of otitis media or otitis externa.  Urine reviewed/interpreted and shows no sign of infection or dehydration.  Chest x-ray reviewed/interpreted and shows no acute abnormality.  Covid test pending.  He will quarantine until his test results are back.  Will ambulate patient.  If able to ambulate with steady gait, will discharge home with meclizine and outpatient ENT follow-up.  Patient able to ambulate with steady gait.  Will discharge home with prescription of meclizine.  Will provide with work note.  Discussed return precautions and supportive care instructions.   At this time, I do not feel there is any life-threatening condition present. I have reviewed, interpreted and discussed all  results (EKG, imaging, lab, urine as appropriate) and exam findings with patient/family. I have reviewed nursing notes and appropriate previous records.  I feel the patient is safe to be discharged home without further emergent workup and can continue workup as an outpatient as needed. Discussed usual and customary return precautions. Patient/family verbalize understanding and are comfortable with this plan.  Outpatient follow-up has been provided as needed. All questions have been answered.    EKG Interpretation  Date/Time:  Monday January 05 2020 01:00:07 EDT Ventricular Rate:  57 PR Interval:    QRS Duration: 108 QT Interval:  444 QTC Calculation: 433 R Axis:   38 Text Interpretation: Sinus rhythm Borderline T wave abnormalities No significant change since last tracing Confirmed by Rochele Raring [id number]) on 01/05/2020 1:41:05 AM      .Ear Cerumen Removal  Date/Time: 01/05/2020 4:57 AM Performed by: Julieanne Cotton, RN Authorized by: Deshane Cotroneo, Layla Maw, DO   Consent:    Consent obtained:  Verbal   Consent given by:  Patient   Risks discussed:  Bleeding, dizziness, infection, incomplete removal, pain and TM perforation   Alternatives discussed:  No treatment Procedure details:    Location:  L ear and R ear   Procedure type: irrigation   Post-procedure details:    Inspection:  TM intact   Hearing quality:  Normal  Patient tolerance of procedure:  Tolerated well, no immediate complications      Dale Evans was evaluated in Emergency Department on 01/05/2020 for the symptoms described in the history of present illness. He was evaluated in the context of the global COVID-19 pandemic, which necessitated consideration that the patient might be at risk for infection with the SARS-CoV-2 virus that causes COVID-19. Institutional protocols and algorithms that pertain to the evaluation of patients at risk for COVID-19 are in a state of rapid change based on information released by regulatory  bodies including the CDC and federal and state organizations. These policies and algorithms were followed during the patient's care in the ED.      Akera Snowberger, Delice Bison, DO 01/05/20 [place], Delice Bison, DO 01/05/20 [phone]

## 2020-01-05 NOTE — ED Notes (Signed)
Ambulated pt, pt reports feeling dizzy but improvement in gait and eyesight. Slow but steady gait.

## 2020-01-05 NOTE — Discharge Instructions (Addendum)
Your labs, EKG, urine, chest x-ray, head CT were normal today.  I recommend close follow-up with your PCP and ENT if symptoms or not improving in 1 to 2 weeks.  We have also tested you for COVID-19.  These test results are pending.  Please quarantine until you have your test results back.  If your test results are positive, you will need to quarantine for at least 10 days after the onset of symptoms.  Steps to find a Primary Care Provider (PCP):  Call [phone] or [phone] to access "[place] Find a Doctor Service."  2.  You may also go on the [facility] website at [email or web address]  3.  [facility] also frequently accepts new patients.  [facility]  [address] [place] [address] [phone]  4.  There are also multiple [facility], [facility] and [facility] practices throughout the [place] that are frequently accepting new patients. You may find a clinic that is close to your home and contact them.  [email or web address] [phone]  [facility] [email or web address]  [email or web address] [phone]  [facility] [email or web address] [phone]  5.  Local Health Departments also can provide primary care services.  [facility]  [address] [place] [address] [phone]  [facility] [address] [place] [address] [phone]  [facility] [address] [place] [address]  [place] [address] [phone]

## 2020-01-06 ENCOUNTER — Emergency Department (HOSPITAL_COMMUNITY)
Admission: EM | Admit: 2020-01-06 | Discharge: 2020-01-07 | Disposition: A | Discharge status: Home / Self Care | Payer: Self-pay | Attending: Emergency Medicine | Admitting: Emergency Medicine

## 2020-01-06 ENCOUNTER — Encounter (HOSPITAL_COMMUNITY): Payer: Self-pay

## 2020-01-06 DIAGNOSIS — R519 Headache, unspecified: Secondary | ICD-10-CM | POA: Insufficient documentation

## 2020-01-06 DIAGNOSIS — R5383 Other fatigue: Secondary | ICD-10-CM | POA: Insufficient documentation

## 2020-01-06 DIAGNOSIS — Z20822 Contact with and (suspected) exposure to covid-19: Secondary | ICD-10-CM | POA: Insufficient documentation

## 2020-01-06 DIAGNOSIS — B349 Viral infection, unspecified: Secondary | ICD-10-CM | POA: Insufficient documentation

## 2020-01-06 DIAGNOSIS — R42 Dizziness and giddiness: Secondary | ICD-10-CM | POA: Insufficient documentation

## 2020-01-06 NOTE — ED Triage Notes (Signed)
This nurse and several techs were called to patients car by wife for LOC.  Pt head back in seat, after 30 seconds pt aroused and stated "I feel out of it".  Pt was able to get self into wheelchair.   Pt was seen 2 days ago for veritgo, generalized aches, "feeling out of it".  Pt states "I got scared at home and I came here".

## 2020-01-07 ENCOUNTER — Emergency Department (HOSPITAL_COMMUNITY): Payer: Self-pay

## 2020-01-07 LAB — URINALYSIS, ROUTINE W REFLEX MICROSCOPIC
Bacteria, UA: NONE SEEN
Bilirubin Urine: NEGATIVE
Glucose, UA: NEGATIVE mg/dL
Hgb urine dipstick: NEGATIVE
Ketones, ur: NEGATIVE mg/dL
Leukocytes,Ua: NEGATIVE
Nitrite: NEGATIVE
Protein, ur: 30 mg/dL — AB
Specific Gravity, Urine: 1.03 (ref 1.005–1.030)
pH: 5 (ref 5.0–8.0)

## 2020-01-07 LAB — CBC
HCT: 42.1 % (ref 39.0–52.0)
Hemoglobin: 14.1 g/dL (ref 13.0–17.0)
MCH: 28.9 pg (ref 26.0–34.0)
MCHC: 33.5 g/dL (ref 30.0–36.0)
MCV: 86.3 fL (ref 80.0–100.0)
Platelets: 233 10*3/uL (ref 150–400)
RBC: 4.88 MIL/uL (ref 4.22–5.81)
RDW: 12.3 % (ref 11.5–15.5)
WBC: 6.7 10*3/uL (ref 4.0–10.5)
nRBC: 0 % (ref 0.0–0.2)

## 2020-01-07 LAB — RAPID URINE DRUG SCREEN, HOSP PERFORMED
Amphetamines: NOT DETECTED
Barbiturates: NOT DETECTED
Benzodiazepines: NOT DETECTED
Cocaine: NOT DETECTED
Opiates: NOT DETECTED
Tetrahydrocannabinol: NOT DETECTED

## 2020-01-07 LAB — RESPIRATORY PANEL BY RT PCR (FLU A&B, COVID)
Influenza A by PCR: NEGATIVE
Influenza B by PCR: NEGATIVE
SARS Coronavirus 2 by RT PCR: NEGATIVE

## 2020-01-07 LAB — BASIC METABOLIC PANEL
Anion gap: 10 (ref 5–15)
BUN: 12 mg/dL (ref 6–20)
CO2: 26 mmol/L (ref 22–32)
Calcium: 9.5 mg/dL (ref 8.9–10.3)
Chloride: 103 mmol/L (ref 98–111)
Creatinine, Ser: 1.29 mg/dL — ABNORMAL HIGH (ref 0.61–1.24)
GFR calc Af Amer: >60 mL/min (ref >60)
GFR calc non Af Amer: >60 mL/min (ref >60)
Glucose, Bld: 98 mg/dL (ref 70–99)
Potassium: 3.8 mmol/L (ref 3.5–5.1)
Sodium: 139 mmol/L (ref 135–145)

## 2020-01-07 LAB — C-REACTIVE PROTEIN: CRP: 0.5 mg/dL (ref <1.0)

## 2020-01-07 LAB — SEDIMENTATION RATE: Sed Rate: 15 mm/hr (ref 0–16)

## 2020-01-07 MED ORDER — DIPHENHYDRAMINE HCL 50 MG/ML IJ SOLN
25.0000 mg | Freq: Once | INTRAMUSCULAR | Status: AC
  Administered 2020-01-07: 25 mg via INTRAVENOUS
  Filled 2020-01-07: qty 1

## 2020-01-07 MED ORDER — ONDANSETRON 4 MG PO TBDP: 4.0000 mg | ORAL_TABLET | ORAL | 0 refills | Status: DC | PRN

## 2020-01-07 MED ORDER — KETOROLAC TROMETHAMINE 30 MG/ML IJ SOLN
30.0000 mg | Freq: Once | INTRAMUSCULAR | Status: AC
  Administered 2020-01-07: 30 mg via INTRAVENOUS
  Filled 2020-01-07: qty 1

## 2020-01-07 MED ORDER — NAPROXEN 500 MG PO TABS
500.0000 mg | ORAL_TABLET | Freq: Two times a day (BID) | ORAL | 0 refills | Status: DC
Start: 2020-01-07 — End: 2020-01-19

## 2020-01-07 MED ORDER — DIPHENHYDRAMINE HCL 25 MG PO TABS
25.0000 mg | ORAL_TABLET | Freq: Four times a day (QID) | ORAL | 0 refills | Status: DC | PRN
Start: 2020-01-07 — End: 2022-11-16

## 2020-01-07 MED ORDER — METOCLOPRAMIDE HCL 10 MG PO TABS
10.0000 mg | ORAL_TABLET | Freq: Four times a day (QID) | ORAL | 0 refills | Status: DC | PRN
Start: 2020-01-07 — End: 2020-01-19

## 2020-01-07 MED ORDER — SODIUM CHLORIDE 0.9 % IV BOLUS
1000.0000 mL | Freq: Once | INTRAVENOUS | Status: AC
  Administered 2020-01-07: 1000 mL via INTRAVENOUS

## 2020-01-07 MED ORDER — METOCLOPRAMIDE HCL 5 MG/ML IJ SOLN
10.0000 mg | Freq: Once | INTRAMUSCULAR | Status: AC
  Administered 2020-01-07: 10 mg via INTRAVENOUS
  Filled 2020-01-07: qty 2

## 2020-01-07 NOTE — ED Provider Notes (Signed)
[facility] EMERGENCY DEPARTMENT Provider Note   CSN: [id number] Arrival date & time: 01/06/20  2242     History Chief Complaint  Patient presents with  . Generalized Body Aches    Dale Evans is a 29 y.o. male.  HPI Patient reports that symptoms started 6 days ago at work.  Patient reports that he got a posterior headache and felt really fatigued and dizzy.  He describes it as gradual onset but it started at work and was fairly severe within a few hours.  He reports he went and sat down out back and fell asleep for 2 hours.  He did not feel like himself.  He reports since that time, the symptoms have been getting worse.  He reports his headache is now generalized and aching in quality.  It has not abated in approximately 6 days.  Patient reports his vision is generally blurry, he thinks it is both eyes.  No double vision or focal loss of vision.  He reports his symptom is much worse when he looks at the light.  He does endorse light sensitivity.  He has been nauseated but no episodes of vomiting.  He has not measured any fever at home.  He reports that he came back to the emergency department today because he was trying to go down the stairs with his wife helping him and he fell landing on his right knee.  He is not sure if he hit his head when he fell.  He reports he fell because he thinks he was so dizzy.  He does not feel that the leg gave way due to focal weakness.  The dizziness is spinning in quality.  He reports it does not improve with closing his eyes.  He has tried the meclizine prescribed yesterday but feels like it is getting worse. Symptoms have also included generalized body ache and severe fatigue.  Patient reports he has had very minimal amount of cough.  He does not feel short of breath or have chest pain.  No abdominal pain.  Nausea but no vomiting or diarrhea.  Patient has not had Covid immunizations.  Social history: pAtient reports that he occasionally  smokes some CBD.  Last use was about 3 days before symptoms onset.  He denies that he smokes marijuana.  No other drug use.  No alcohol use.  Family history: Some family members with cancer.  No family members with aneurysms or brain tumors that the patient knows of.    History reviewed. No pertinent past medical history.  There are no problems to display for this patient.        History reviewed. No pertinent family history.  Social History   Tobacco Use  . Smoking status: Not on file  Substance Use Topics  . Alcohol use: Not on file  . Drug use: Not on file    Home Medications Prior to Admission medications   Medication Sig Start Date End Date Taking? Authorizing Provider  diphenhydrAMINE (BENADRYL) 25 MG tablet Take 1 tablet (25 mg total) by mouth every 6 (six) hours as needed. 01/07/20   Arby Barrette, MD  metoCLOPramide (REGLAN) 10 MG tablet Take 1 tablet (10 mg total) by mouth every 6 (six) hours as needed for nausea (nausea/headache). 01/07/20   Arby Barrette, MD  naproxen (NAPROSYN) 500 MG tablet Take 1 tablet (500 mg total) by mouth 2 (two) times daily. 01/07/20   Arby Barrette, MD  ondansetron (ZOFRAN ODT) 4 MG disintegrating tablet Take  1 tablet (4 mg total) by mouth every 4 (four) hours as needed for nausea or vomiting. 01/07/20   Arby Barrette, MD    Allergies    Amoxicillin  Review of Systems   Review of Systems 10 Systems reviewed and are negative for acute change except as noted in the HPI.  Physical Exam Updated Vital Signs BP (!) 146/79   Pulse (!) 53   Temp 98.2 F (36.8 C) (Oral)   Resp 20   SpO2 98%   Physical Exam Constitutional:      Comments: Alert and nontoxic.  No respiratory distress.  Well-nourished well-developed.  HENT:     Head: Normocephalic and atraumatic.     Right Ear: Tympanic membrane normal.     Left Ear: Tympanic membrane normal.     Nose: Nose normal.     Mouth/Throat:     Mouth: Mucous membranes are moist.      Pharynx: Oropharynx is clear.  Eyes:     Extraocular Movements: Extraocular movements intact.     Conjunctiva/sclera: Conjunctivae normal.     Pupils: Pupils are equal, round, and reactive to light.  Cardiovascular:     Rate and Rhythm: Normal rate and regular rhythm.  Pulmonary:     Effort: Pulmonary effort is normal.     Breath sounds: Normal breath sounds.  Abdominal:     General: There is no distension.     Palpations: Abdomen is soft.     Tenderness: There is no abdominal tenderness. There is no guarding.  Musculoskeletal:        General: No swelling or tenderness. Normal range of motion.     Cervical back: Neck supple. No rigidity.     Right lower leg: No edema.     Left lower leg: No edema.  Lymphadenopathy:     Cervical: No cervical adenopathy.  Skin:    General: Skin is warm and dry.     Findings: No rash.  Neurological:     General: No focal deficit present.     Mental Status: He is oriented to person, place, and time.     Cranial Nerves: No cranial nerve deficit.     Sensory: No sensory deficit.     Motor: No weakness.     Coordination: Coordination normal.     Comments: Speech is clear.  Cognitive function is normal.  No receptive or expressive aphasia.  Finger-nose exam intact bilaterally.  Upper and lower motor strength 5\5.  Extraocular motions intact and conjugate.  Psychiatric:        Mood and Affect: Mood normal.     ED Results / Procedures / Treatments   Labs (all labs ordered are listed, but only abnormal results are displayed) Labs Reviewed  BASIC METABOLIC PANEL - Abnormal; Notable for the following components:      Result Value   Creatinine, Ser 1.29 (*)    All other components within normal limits  URINALYSIS, ROUTINE W REFLEX MICROSCOPIC - Abnormal; Notable for the following components:   Protein, ur 30 (*)    All other components within normal limits  RESPIRATORY PANEL BY RT PCR (FLU A&B, COVID)  CBC  SEDIMENTATION RATE  C-REACTIVE PROTEIN    RAPID URINE DRUG SCREEN, HOSP PERFORMED    EKG None  Radiology MR ANGIO HEAD WO CONTRAST  Result Date: 01/07/2020 CLINICAL DATA:  Vertigo, central. Headache, acute, normal neuro exam. Vertigo. Severe posterior headache. EXAM: MRI HEAD WITHOUT CONTRAST MRA HEAD WITHOUT CONTRAST TECHNIQUE: Multiplanar, multiecho pulse  sequences of the brain and surrounding structures were obtained without intravenous contrast. Angiographic images of the head were obtained using MRA technique without contrast. COMPARISON:  Noncontrast head CT 01/04/2019 FINDINGS: MRI HEAD FINDINGS Brain: There is a single nonspecific punctate focus of T2 hyperintensity within the left frontal lobe white matter (series 9, image 15). There is no acute infarct. No evidence of intracranial mass. No chronic intracranial blood products. No extra-axial fluid collection. No midline shift. Vascular: Reported below. Skull and upper cervical spine: No focal marrow lesion. Sinuses/Orbits: Visualized orbits show no acute finding. Mild paranasal sinus mucosal thickening greatest within the bilateral ethmoid air cells. No significant mastoid effusion. MRA HEAD FINDINGS The intracranial internal carotid arteries are patent without significant stenosis. The M1 middle cerebral arteries are patent without significant stenosis. No M2 proximal branch occlusion or high-grade proximal stenosis is identified. The anterior cerebral arteries are patent without significant proximal stenosis. No intracranial aneurysm is identified. The intracranial vertebral arteries are patent without significant stenosis, as is the basilar artery. The left vertebral artery is dominant. The posterior cerebral arteries are patent without significant proximal stenosis. Posterior communicating arteries are poorly delineated and may be hypoplastic or absent bilaterally. IMPRESSION: MRI brain: 1. No evidence of acute intracranial abnormality. 2. There is a single nonspecific punctate focus  of T2 hyperintensity within the left frontal lobe white matter. 3. Otherwise unremarkable noncontrast MRI appearance of the brain. 4. Mild paranasal sinus mucosal thickening. MRA head: Unremarkable MR angiography of the head. No intracranial large vessel occlusion or proximal high-grade arterial stenosis. Electronically Signed   By: Jackey Loge DO   On: 01/07/2020 11:08   MR Brain Wo Contrast (neuro protocol)  Result Date: 01/07/2020 CLINICAL DATA:  Vertigo, central. Headache, acute, normal neuro exam. Vertigo. Severe posterior headache. EXAM: MRI HEAD WITHOUT CONTRAST MRA HEAD WITHOUT CONTRAST TECHNIQUE: Multiplanar, multiecho pulse sequences of the brain and surrounding structures were obtained without intravenous contrast. Angiographic images of the head were obtained using MRA technique without contrast. COMPARISON:  Noncontrast head CT 01/04/2019 FINDINGS: MRI HEAD FINDINGS Brain: There is a single nonspecific punctate focus of T2 hyperintensity within the left frontal lobe white matter (series 9, image 15). There is no acute infarct. No evidence of intracranial mass. No chronic intracranial blood products. No extra-axial fluid collection. No midline shift. Vascular: Reported below. Skull and upper cervical spine: No focal marrow lesion. Sinuses/Orbits: Visualized orbits show no acute finding. Mild paranasal sinus mucosal thickening greatest within the bilateral ethmoid air cells. No significant mastoid effusion. MRA HEAD FINDINGS The intracranial internal carotid arteries are patent without significant stenosis. The M1 middle cerebral arteries are patent without significant stenosis. No M2 proximal branch occlusion or high-grade proximal stenosis is identified. The anterior cerebral arteries are patent without significant proximal stenosis. No intracranial aneurysm is identified. The intracranial vertebral arteries are patent without significant stenosis, as is the basilar artery. The left vertebral  artery is dominant. The posterior cerebral arteries are patent without significant proximal stenosis. Posterior communicating arteries are poorly delineated and may be hypoplastic or absent bilaterally. IMPRESSION: MRI brain: 1. No evidence of acute intracranial abnormality. 2. There is a single nonspecific punctate focus of T2 hyperintensity within the left frontal lobe white matter. 3. Otherwise unremarkable noncontrast MRI appearance of the brain. 4. Mild paranasal sinus mucosal thickening. MRA head: Unremarkable MR angiography of the head. No intracranial large vessel occlusion or proximal high-grade arterial stenosis. Electronically Signed   By: Jackey Loge DO   On: 01/07/2020  11:08    Procedures Procedures (including critical care time)  Medications Ordered in ED Medications  ketorolac (TORADOL) 30 MG/ML injection 30 mg (has no administration in time range)  metoCLOPramide (REGLAN) injection 10 mg (10 mg Intravenous Given 01/07/20 0847)  diphenhydrAMINE (BENADRYL) injection 25 mg (25 mg Intravenous Given 01/07/20 0846)  sodium chloride 0.9 % bolus 1,000 mL (0 mLs Intravenous Stopped 01/07/20 1231)    ED Course  I have reviewed the triage vital signs and the nursing notes.  Pertinent labs & imaging results that were available during my care of the patient were reviewed by me and considered in my medical decision making (see chart for details).    MDM Rules/Calculators/A&P                      Patient with persisting headache and vertigo for 6 days.  MRI does not show any intracranial etiology.  MRA obtained as well.  No sign of aneurysm or hemorrhage.  Patient has also been experiencing myalgias and fatigue.  Consideration for possible viral syndrome.  Patient has tested negative for Covid and influenza.  Sed rate and CRP are normal.  Patient is not hypertensive.  Consideration also given to migraine type headache.  Patient however does not have any history of same.  He did get some  improvement with migraine therapy in the emergency department.  We will continue medications for symptom control with naproxen, Benadryl and Reglan.  Commendation for rest and fluids for several days.  Ambulatory referral to neurology provided.  Return precautions reviewed.  Patient is discharged in good condition.  He is alert with normal mental status.  He is clinically well in appearance.  No neurologic deficits on exam.  Blood pressure stable. Final Clinical Impression(s) / ED Diagnoses Final diagnoses:  Bad headache  Vertigo  Viral syndrome    Rx / DC Orders ED Discharge Orders         Ordered    metoCLOPramide (REGLAN) 10 MG tablet  Every 6 hours PRN     01/07/20 1229    diphenhydrAMINE (BENADRYL) 25 MG tablet  Every 6 hours PRN     01/07/20 1229    naproxen (NAPROSYN) 500 MG tablet  2 times daily     01/07/20 1229    ondansetron (ZOFRAN ODT) 4 MG disintegrating tablet  Every 4 hours PRN     01/07/20 1229    Ambulatory referral to Neurology    Comments: An appointment is requested in approximately: 1 week   01/07/20 1234           Charlesetta Shanks, MD 01/07/20 1241

## 2020-01-07 NOTE — Discharge Instructions (Signed)
1.  Take Reglan and Benadryl together every 6 hours as needed for headache.  Take naproxen twice daily as prescribed.  Rest in a dark room and stay well-hydrated. 2.  Make an appointment with [facility] for follow-up.  An ambulatory referral was made during your stay in the emergency department. 3.  Make an appointment with a family physician as well.  You may call [facility] or use the referral number in your discharge instructions. 4.  Return to the emergency department if you develop fevers, symptoms not improving with treatment or new and changing symptoms.

## 2020-01-08 ENCOUNTER — Encounter (HOSPITAL_COMMUNITY): Payer: Self-pay | Admitting: Emergency Medicine

## 2020-01-19 ENCOUNTER — Other Ambulatory Visit: Payer: Self-pay

## 2020-01-19 ENCOUNTER — Ambulatory Visit: Payer: Self-pay | Admitting: Neurology

## 2020-01-19 ENCOUNTER — Encounter: Payer: Self-pay | Admitting: Neurology

## 2020-01-19 VITALS — BP 158/94 | HR 54 | Temp 97.5°F | Ht 75.0 in | Wt 318.0 lb

## 2020-01-19 DIAGNOSIS — H8110 Benign paroxysmal vertigo, unspecified ear: Secondary | ICD-10-CM

## 2020-01-19 MED ORDER — ALPRAZOLAM 0.5 MG PO TABS: 0.5000 mg | ORAL_TABLET | ORAL | 0 refills | Status: DC | PRN

## 2020-01-19 NOTE — Patient Instructions (Signed)

## 2020-01-19 NOTE — Progress Notes (Signed)
Provider:  Larey Seat, MD  Primary Care Physician:    Referring Provider: Charlesetta Shanks, Md [address] [place],  [place] [address]          Chief Complaint according to patient   Patient presents with:    . New Patient (Initial Visit)     pt alone, rm 11. pt presents today due to having vertigo spells. pt states that first had this problem in 2014 and since then he has only had it once more in 2020 while he was sick from [place], and now for a third time.-this  one was the longest.  He states he noted severe , acute onset, while on way to work- 4/22 woke up taking daughter to school felt slight dizziness. went home slept and woke up to go to work and it was gone.  next day same thing but was worse and he was unable to drive. laid down but woke up and still present. states room would be spinning , no me atter if he was sitting or standing.       Other 4/25/ ED visit- first time -went home.  He had a lot of myalgia body aches and pains, and the dizziness was just getting worse while at home.  Therefore, on 4/27 his wife encouraged him to get up and walk around, he went out and missed a step , he fell flat on his face. He states next thing he remembered was waking up in car heading to ER again. Describes no recollection of the trip to ED, and came to at ED,   he states vertigo episode lasted 4/22-01/15/2020 and then just stopped.  no complaints since 01/15/20.         HISTORY OF PRESENT ILLNESS:  Dale Evans is a 29 y.o. year old 4 or [place] American male patient seen here upon an ED based referral on 01/19/2020 for a vertigo evaluation.  Chief concern according to patient :  " I had a hard 2 weeks" started on 4/ 22/2021    I have the pleasure of seeing [name] on 5/ 10/ 2021, a right -handed  African American male who  has a past medical history of Vertigo.   The patient described above that he had actually 3 spells of.'s of vertigo in the past the first 1 in 2014,  the second 1 in 2020 while suffering from Covid.  The third for now.  This 1 was the longest presenting with symptoms and also the most severe.  It seems that he went to the emergency room more than once in the second time he had actually fallen due to vertigo and was amnestic for the whole transport time to the emergency room.  This assessment ED visit on 428 he was given Reglan, Benadryl, Naprosyn, Zofran and an ambulatory referral to me.  He had an MRI of the brain which was normal there is no aneurysm no evidence of stroke blockage stenosis etc.   the 2 days prior emergency room evaluation stated that he had a blood pressure of 135/79, and elevated pulse rate of Humbert a temperature of 36.6 Celsius respirations at 18, oxygen saturation 98% he was alert and oriented and appropriately repeat responding to questions.  No heart murmur no pulmonary infiltrate, he also had complained of chills body aches shortness of breath and diarrhea Darrold Span preceding his ER visit and the ER doctor was concerned that this may be COVID-19 symptoms.  He was tested in the emergency room, chest x-ray was normal EKG was normal and he was given an appointment with [facility] for Vertigo at that day.     Social history:  Patient is working for [employer] - out,  and lives in a household with 4 persons/ alone. Wife and 2 children. The patient currently works in shifts( night/ rotating,) 12 hours.  Tobacco use: never.  ETOH use; none , Caffeine intake in form of Coffee( 1 AM  Soda( none ) Tea ( 2/ week) No energy drinks. He goes to gym 4 times a week, is also a Stage manager.     Review of Systems: Out of a complete 14 system review, the patient complains of only the following symptoms, and all other reviewed systems are negative.:  Vertigo -     Social History   Socioeconomic History  . Marital status: Married    Spouse name: Not on file  . Number of children: Not on file  . Years of education: Not on file  .  Highest education level: Not on file  Occupational History  . Not on file  Tobacco Use  . Smoking status: Never Smoker  . Smokeless tobacco: Never Used  Substance and Sexual Activity  . Alcohol use: No  . Drug use: Not Currently    Comment: former user of CBD last used 12/11/19  . Sexual activity: Not on file  Other Topics Concern  . Not on file  Social History Narrative   ** Merged History Encounter **       Social Determinants of Health   Financial Resource Strain:   . Difficulty of Paying Living Expenses:   Food Insecurity:   . Worried About Programme researcher, broadcasting/film/video in the Last Year:   . Barista in the Last Year:   Transportation Needs:   . Freight forwarder (Medical):   Marland Kitchen Lack of Transportation (Non-Medical):   Physical Activity:   . Days of Exercise per Week:   . Minutes of Exercise per Session:   Stress:   . Feeling of Stress :   Social Connections:   . Frequency of Communication with Friends and Family:   . Frequency of Social Gatherings with Friends and Family:   . Attends Religious Services:   . Active Member of Clubs or Organizations:   . Attends Banker Meetings:   Marland Kitchen Marital Status:     Family History  Problem Relation Age of Onset  . Healthy Mother   . Cancer Mother   . Hypertension Mother   . Healthy Father   . Diabetes Father   . Hypertension Maternal Grandmother   . Stroke Maternal Grandmother     Past Medical History:  Diagnosis Date  . Vertigo     Past Surgical History:  Procedure Laterality Date  . KNEE SURGERY    . TONSILLECTOMY       Current Outpatient Medications on File Prior to Visit  Medication Sig Dispense Refill  . diphenhydrAMINE (BENADRYL) 25 MG tablet Take 1 tablet (25 mg total) by mouth every 6 (six) hours as needed. 30 tablet 0  . ibuprofen (ADVIL) 600 MG tablet Take 1 tablet (600 mg total) by mouth every 6 (six) hours as needed. 30 tablet 0  . meclizine (ANTIVERT) 50 MG tablet Take 1 tablet (50 mg  total) by mouth 3 (three) times daily as needed. 30 tablet 0   No current facility-administered medications on file prior to visit.  Allergies  Allergen Reactions  . Licorice Flavor [Artificial Licorice Flavor] Anaphylaxis  . Amoxicillin Nausea And Vomiting and Swelling    Childhood allergy. Has patient had a PCN reaction causing immediate rash, facial/tongue/throat swelling, SOB or lightheadedness with hypotension: No Has patient had a PCN reaction causing severe rash involving mucus membranes or skin necrosis: No Has patient had a PCN reaction that required hospitalization No Has patient had a PCN reaction occurring within the last 10 years: No If all of the above answers are "NO", then may proceed with Cephalosporin use.   Marland Kitchen Amoxicillin Nausea And Vomiting    Physical exam:  Today's Vitals   01/19/20 1058  BP: (!) 158/94  Pulse: (!) 54  Temp: (!) 97.5 F (36.4 C)  Weight: (!) 318 lb (144.2 kg)  Height: 6\' 3"  (1.905 m)   Body mass index is 39.75 kg/m.   Wt Readings from Last 3 Encounters:  01/19/20 (!) 318 lb (144.2 kg)  12/03/19 (!) 330 lb (149.7 kg)  03/19/19 (!) 305 lb (138.3 kg)     Ht Readings from Last 3 Encounters:  01/19/20 6\' 3"  (1.905 m)  12/03/19 6\' 3"  (1.905 m)  03/19/19 6\' 2"  (1.88 m)      General: The patient is awake, alert and appears not in acute distress. The patient is well groomed. Head: Normocephalic, atraumatic. Neck is supple. Mallampati  3 plus ,  neck circumference: 21 inches .  Nasal airflow  patent.  Retrognathia is seen.   Cardiovascular:  Regular rate and cardiac rhythm by pulse,  without distended neck veins. Respiratory: Lungs are clear to auscultation.  Skin:  Without evidence of ankle edema, or rash. Trunk: The patient's posture is erect.   Neurologic exam : The patient is awake and alert, oriented to place and time.   Memory subjective described as intact.  Attention span & concentration ability appears normal.  Speech  is fluent,  without  dysarthria, dysphonia or aphasia.  Mood and affect are appropriate.   Cranial nerves: no loss of smell or taste reported  Pupils are equal and briskly reactive to light. Funduscopic exam deferred.   Extraocular movements in vertical and horizontal planes were intact and without nystagmus.  No Diplopia now - he had diplopia, blurred vision and photophobia. Visual fields by finger perimetry are intact. Hearing was intact to soft voice and finger rubbing.    Facial sensation intact to fine touch.  Facial motor strength is symmetric and tongue and uvula move midline.  Neck ROM : rotation, tilt and flexion extension were normal for age and shoulder shrug was symmetrical.    Motor exam:  Symmetric bulk, tone and ROM.   Normal tone without cog wheeling, symmetric grip strength . Sensory:  Fine touch, pinprick and vibration were tested  and  normal.  Proprioception tested in the upper extremities was normal.  Coordination: Rapid alternating movements in the fingers/hands were of normal speed.  The Finger-to-nose maneuver was intact without evidence of ataxia, dysmetria or tremor.  Gait and station: Patient could rise unassisted from a seated position, walked without assistive device.  Stance is of normal width/ base .  Toe and heel walk were deferred.  Deep tendon reflexes: in the upper and lower extremities are symmetric and intact.  Babinski response was deferred.   All symptoms stopped 4 days ago-  COVID swab was negative. Covid and influenza were negative  CRP and sed rate were also normal, 4.28.2021.  Elevated creatinine is normal for his muscle  mass.    After spending a total time of  minutes face to face and additional time for physical and neurologic examination, review of laboratory studies,  personal review of imaging studies, reports and results of other testing and review of referral information / records as far as provided in visit, I have established the  following assessments:  Clockwise vertigo worse when bending down, when bearing down, with  valsalva.   1) He is symptom free today. I advised strongly to get a PCP.  2) He needs readers, 1.5 strength.  3) I will give him the Epley maneuver instructions.  4) prn xanax, 0.5 mg to help avoid ED visits in the future.      In short, Vir D Hoffmann is presenting with a resolution of vertigo. I plan to follow up either personally or through our NP prn.     Electronically signed by: Melvyn Novas, MD 01/19/2020 11:12 AM  [facility] Board certified by The [employer] of Sleep Medicine and Diplomate of the [employer] of Sleep Medicine. Board certified In Neurology through the ABPN, Fellow of the [employer] of Neu[employer]. Medical Director of [facility].

## 2020-08-30 IMAGING — MR MR MRA HEAD W/O CM
7 of 11 series · 20 of 48 positions shown · non-contrast
Comparison: Noncontrast head CT 01/04/2019

CLINICAL DATA: Vertigo, central. Headache, acute, normal neuro
exam. Vertigo. Severe posterior headache.

EXAM:
MRI HEAD WITHOUT CONTRAST
MRA HEAD WITHOUT CONTRAST
TECHNIQUE: Multiplanar, multiecho pulse sequences of the brain and surrounding
structures were obtained without intravenous contrast. Angiographic
images of the head were obtained using MRA technique without
contrast.

[Series 3: DWI · axial · 3.0mm · 0.94mm/px · z∈[-123,+24]mm · 5 of 100 slices shown (1 of 2)]
[im 1/100]
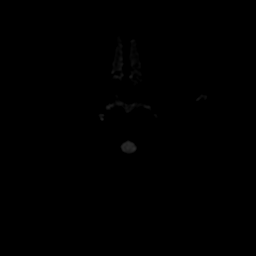
[im 25/100]
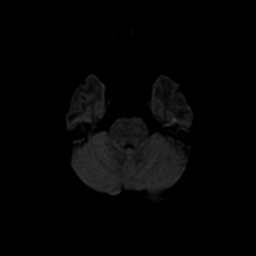
[im 50/100]
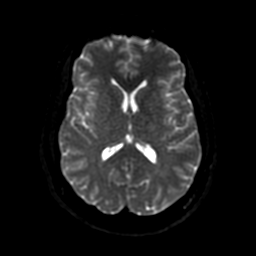
[im 75/100]
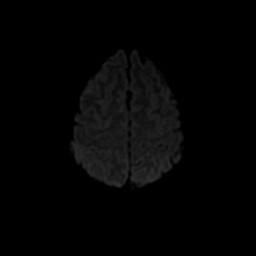
[im 100/100]
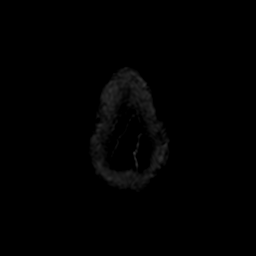

[Series 5: DWI · coronal · 4.0mm · 0.94mm/px · 5 of 74 slices shown (2 of 2)]
[im 1/74]
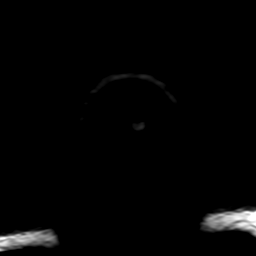
[im 19/74]
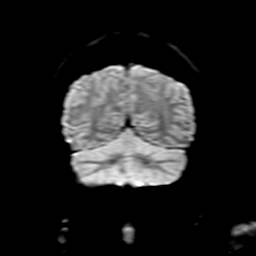
[im 37/74]
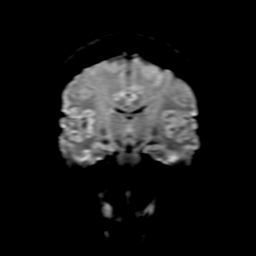
[im 55/74]
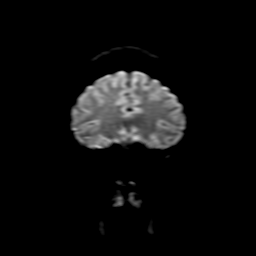
[im 74/74]
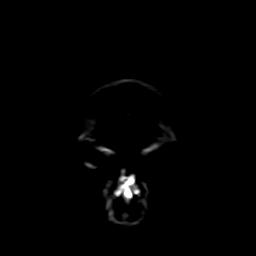

[Series 6: FLAIR · sagittal · 5.0mm · 0.23mm/px · 2 of 25 slices shown (1 of 2)]
[im 1/25]
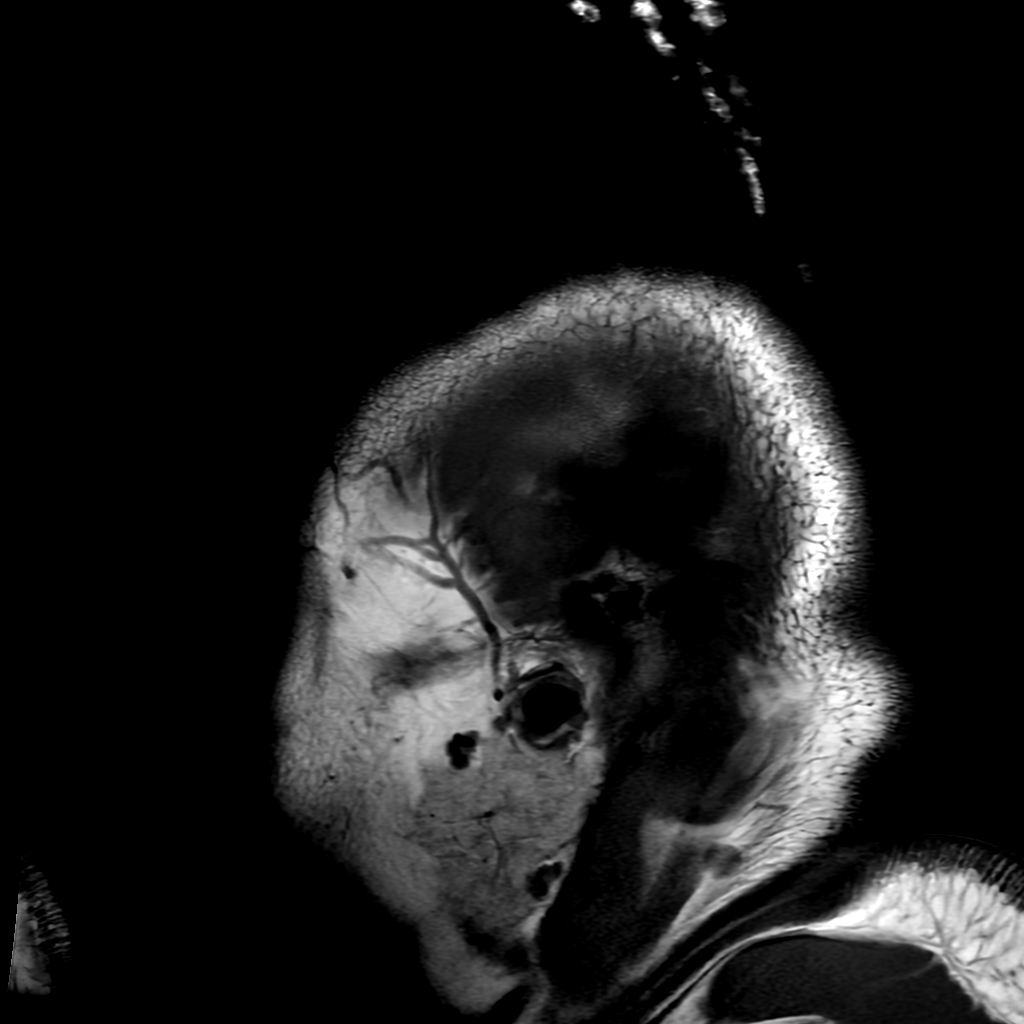
[im 25/25]
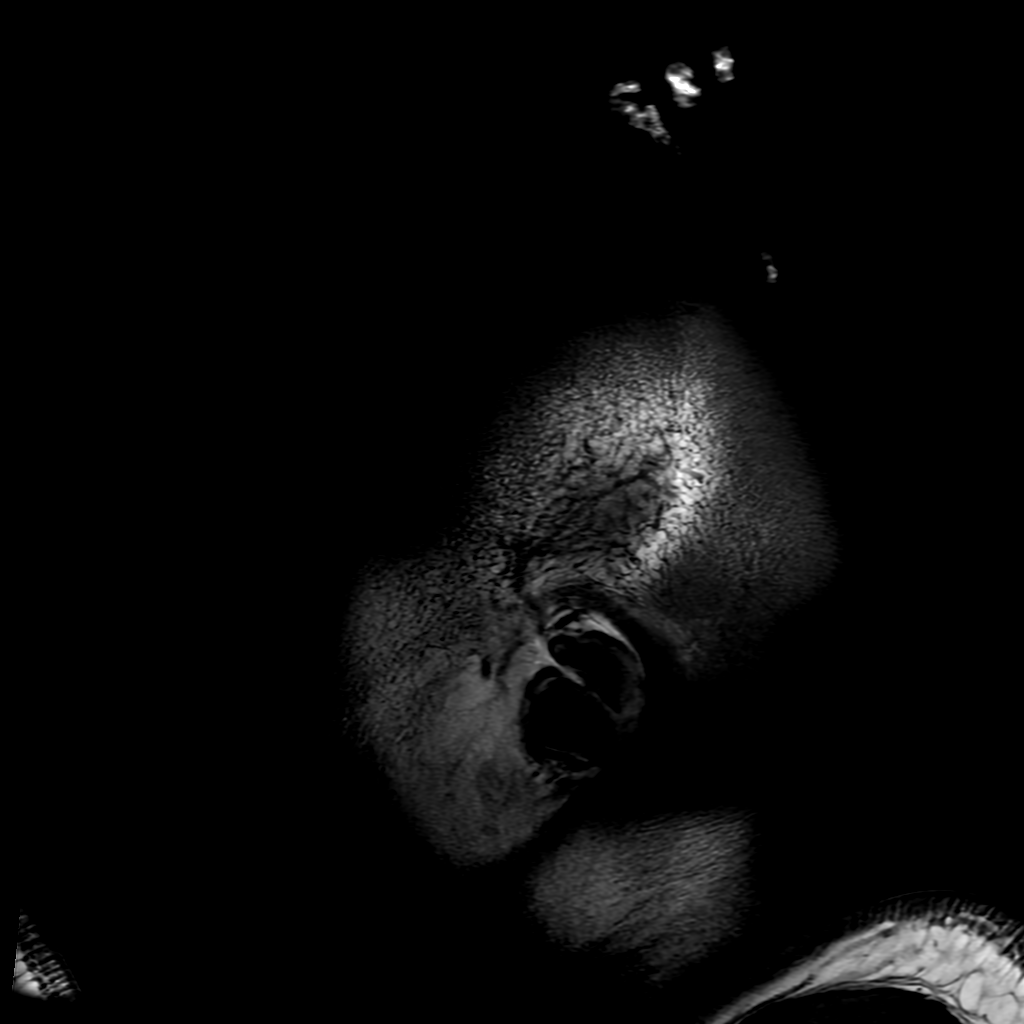

[Series 7: T2 · axial · 5.0mm · 0.23mm/px · 1 of 25 slices shown]
[im 1/25]
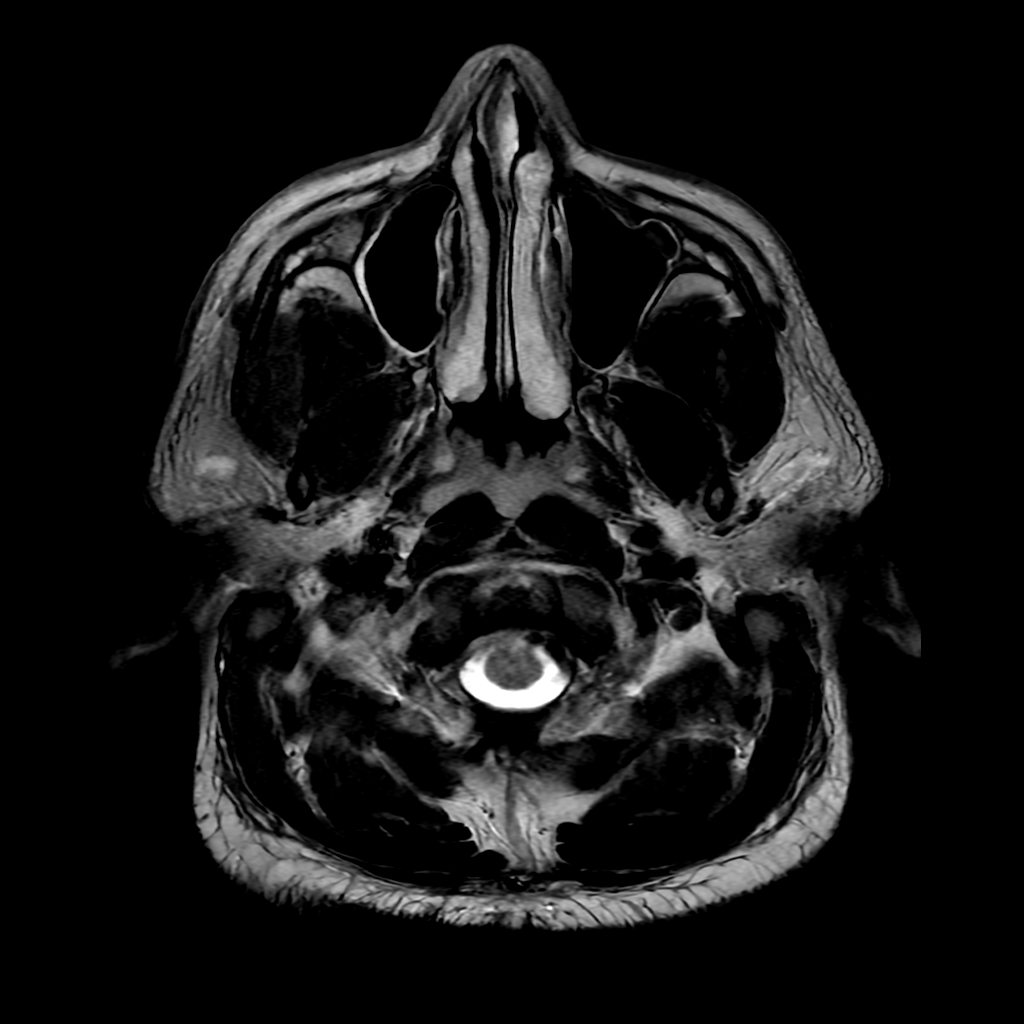

[Series 9: FLAIR · axial · 3.0mm · 0.45mm/px · z∈[-108,+36]mm · 2 of 25 slices shown (2 of 2)]
[im 1/25]
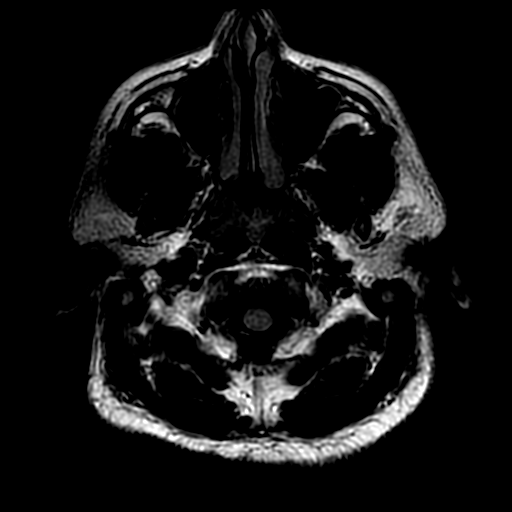
[im 25/25]
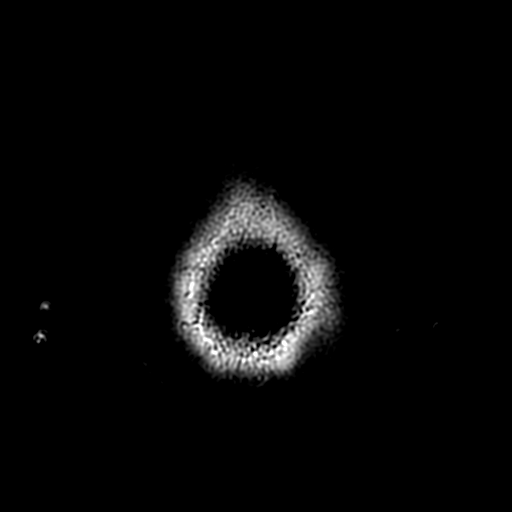

[Series 350: ADC · axial · 3.0mm · 0.94mm/px · z∈[-123,+24]mm · 3 of 50 slices shown (1 of 2)]
[im 1/50]
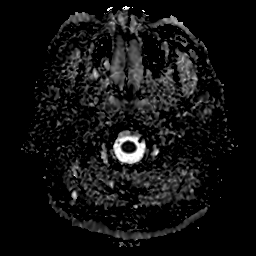
[im 25/50]
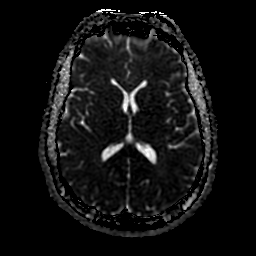
[im 50/50]
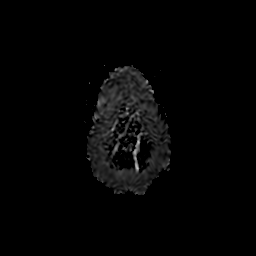

[Series 550: ADC · coronal · 4.0mm · 0.94mm/px · 2 of 37 slices shown (2 of 2)]
[im 1/37]
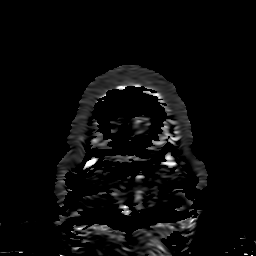
[im 37/37]
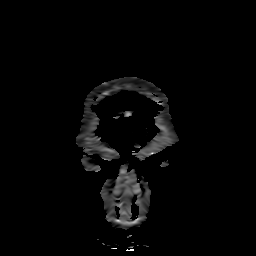

[20 of 48 positions shown; findings below may reference images not displayed]

FINDINGS: MRI HEAD FINDINGS

Brain:

There is a single nonspecific punctate focus of T2 hyperintensity
within the left frontal lobe white matter (series 9, image 15).

There is no acute infarct.

No evidence of intracranial mass.

No chronic intracranial blood products.

No extra-axial fluid collection.

No midline shift.

Vascular: Reported below.

Skull and upper cervical spine: No focal marrow lesion.

Sinuses/Orbits: Visualized orbits show no acute finding. Mild
paranasal sinus mucosal thickening greatest within the bilateral
ethmoid air cells. No significant mastoid effusion.

MRA HEAD FINDINGS

The intracranial internal carotid arteries are patent without
significant stenosis. The M1 middle cerebral arteries are patent
without significant stenosis. No M2 proximal branch occlusion or
high-grade proximal stenosis is identified. The anterior cerebral
arteries are patent without significant proximal stenosis. No
intracranial aneurysm is identified.

The intracranial vertebral arteries are patent without significant
stenosis, as is the basilar artery. The left vertebral artery is
dominant. The posterior cerebral arteries are patent without
significant proximal stenosis. Posterior communicating arteries are
poorly delineated and may be hypoplastic or absent bilaterally.
IMPRESSION: MRI brain:

1. No evidence of acute intracranial abnormality.
2. There is a single nonspecific punctate focus of T2 hyperintensity
within the left frontal lobe white matter.
3. Otherwise unremarkable noncontrast MRI appearance of the brain.
4. Mild paranasal sinus mucosal thickening.

MRA head:

Unremarkable MR angiography of the head. No intracranial large
vessel occlusion or proximal high-grade arterial stenosis.

## 2021-03-13 ENCOUNTER — Other Ambulatory Visit: Payer: Self-pay

## 2021-03-13 ENCOUNTER — Emergency Department (HOSPITAL_COMMUNITY): Payer: Self-pay

## 2021-03-13 ENCOUNTER — Encounter (HOSPITAL_COMMUNITY): Payer: Self-pay | Admitting: Emergency Medicine

## 2021-03-13 ENCOUNTER — Emergency Department (HOSPITAL_COMMUNITY)
Admission: EM | Admit: 2021-03-13 | Discharge: 2021-03-13 | Disposition: A | Discharge status: Home / Self Care | Payer: Self-pay | Attending: Emergency Medicine | Admitting: Emergency Medicine

## 2021-03-13 DIAGNOSIS — S8252XA Displaced fracture of medial malleolus of left tibia, initial encounter for closed fracture: Secondary | ICD-10-CM | POA: Insufficient documentation

## 2021-03-13 DIAGNOSIS — W130XXA Fall from, out of or through balcony, initial encounter: Secondary | ICD-10-CM | POA: Insufficient documentation

## 2021-03-13 MED ORDER — OXYCODONE-ACETAMINOPHEN 5-325 MG PO TABS
1.0000 | ORAL_TABLET | Freq: Once | ORAL | Status: AC
  Administered 2021-03-13: 1 via ORAL
  Filled 2021-03-13: qty 1

## 2021-03-13 MED ORDER — OXYCODONE-ACETAMINOPHEN 5-325 MG PO TABS: 1.0000 | ORAL_TABLET | Freq: Four times a day (QID) | ORAL | 0 refills | Status: DC | PRN

## 2021-03-13 NOTE — ED Provider Notes (Signed)
[facility] EMERGENCY DEPARTMENT Provider Note   CSN: [id number] Arrival date & time: 03/13/21  1113     History No chief complaint on file.   Dale Evans is a 30 y.o. male who presents to the ED today with complaint of sudden onset, constant, worsening, throbbing/aching, left ankle pain s/p fall that occurred last night.  Patient reports that he was on his deck last night with his grandmother.  His grandmother was in the process of falling and he attempted to catch her.  In this process he fell off of the deck that was approximately 2 to 3 feet off the ground and planted his left ankle into the ground to stabilize himself.  No head injury or loss of consciousness.  He states he had immediate pain to the ankle at that time.  He went home and iced it with mild elevation however this morning he woke up with worsening pain.  He states the pain was mostly in his foot initially last night however he noticed pain in his ankle today.  He has not been able to ambulate on his ankle since then.  He has taken over-the-counter medications without much relief.  Denies any previous injury to the ankle.  No other complaints at this time.   The history is provided by the patient and medical records.      Past Medical History:  Diagnosis Date   Vertigo     There are no problems to display for this patient.   Past Surgical History:  Procedure Laterality Date   KNEE SURGERY     TONSILLECTOMY         Family History  Problem Relation Age of Onset   Healthy Mother    Cancer Mother    Hypertension Mother    Healthy Father    Diabetes Father    Hypertension Maternal Grandmother    Stroke Maternal Grandmother     Social History   Tobacco Use   Smoking status: Never   Smokeless tobacco: Never  Substance Use Topics   Alcohol use: No   Drug use: Not Currently    Comment: former user of CBD last used 12/11/19    Home Medications Prior to Admission medications    Medication Sig Start Date End Date Taking? Authorizing Provider  oxyCODONE-acetaminophen (PERCOCET/ROXICET) 5-325 MG tablet Take 1 tablet by mouth every 6 (six) hours as needed for severe pain. 03/13/21  Yes Stina Gane, PA-C  ALPRAZolam Prudy Feeler) 0.5 MG tablet Take 1 tablet (0.5 mg total) by mouth as needed for anxiety. 01/19/20   Dohmeier, Porfirio Mylar, MD  diphenhydrAMINE (BENADRYL) 25 MG tablet Take 1 tablet (25 mg total) by mouth every 6 (six) hours as needed. 01/07/20   Arby Barrette, MD  ibuprofen (ADVIL) 600 MG tablet Take 1 tablet (600 mg total) by mouth every 6 (six) hours as needed. 03/20/19   Derwood Kaplan, MD  meclizine (ANTIVERT) 50 MG tablet Take 1 tablet (50 mg total) by mouth 3 (three) times daily as needed. 01/05/20   Ward, Layla Maw, DO    Allergies    Licorice flavor [artificial licorice flavor], Amoxicillin, and Amoxicillin  Review of Systems   Review of Systems  Constitutional:  Negative for chills and fever.  Musculoskeletal:  Positive for arthralgias and joint swelling.  Skin:  Negative for wound.  Neurological:  Negative for syncope and headaches.  All other systems reviewed and are negative.  Physical Exam Updated Vital Signs BP (!) 155/99 (BP Location: Left  Wrist)   Pulse 60   Temp 98.2 F (36.8 C) (Oral)   Resp 16   SpO2 100%   Physical Exam Vitals and nursing note reviewed.  Constitutional:      Appearance: He is not ill-appearing.  HENT:     Head: Normocephalic and atraumatic.  Eyes:     Conjunctiva/sclera: Conjunctivae normal.  Cardiovascular:     Rate and Rhythm: Normal rate and regular rhythm.     Pulses: Normal pulses.  Pulmonary:     Effort: Pulmonary effort is normal.     Breath sounds: Normal breath sounds. No wheezing, rhonchi or rales.  Musculoskeletal:     Comments: Moderate swelling appreciated to left ankle and dorsum of foot with TTP along bilateral malleoli and dorsal aspect of foot. ROM Limited to ankle s/p pain. Sensation intact  throughout with < 2 cap refill. 2+ DP pulse.   Skin:    General: Skin is warm and dry.     Coloration: Skin is not jaundiced.  Neurological:     Mental Status: He is alert.    ED Results / Procedures / Treatments   Labs (all labs ordered are listed, but only abnormal results are displayed) Labs Reviewed - No data to display  EKG None  Radiology DG Ankle Complete Left  Result Date: 03/13/2021 CLINICAL DATA:  Left lateral foot and ankle pain. Larey Seat off porch today. EXAM: LEFT ANKLE COMPLETE - 3+ VIEW COMPARISON:  05/19/2010 FINDINGS: Mild diffuse soft tissue swelling. There is of fracture involving the tip of medial malleolus, which is minimally displaced. Small chip fracture noted off the anterior aspect distal tibia at the tibiotalar joint. No dislocations. IMPRESSION: Minimally displaced fracture involving the tip of the medial malleolus. Overlying soft tissue swelling noted. Electronically Signed   By: Signa Kell M.D.   On: 03/13/2021 13:07   DG Foot Complete Left  Result Date: 03/13/2021 CLINICAL DATA:  Injury.  Lateral foot pain. EXAM: LEFT FOOT - COMPLETE 3+ VIEW COMPARISON:  None. FINDINGS: Dorsal soft tissue swelling noted. Tiny fracture arises off the anterior lip of the distal tibia at the tibiotalar joint. No dislocations identified. IMPRESSION: Tiny fracture arising off the anterior lip of the distal tibia at the tibiotalar joint. Electronically Signed   By: Signa Kell M.D.   On: 03/13/2021 13:09    Procedures Procedures   Medications Ordered in ED Medications  oxyCODONE-acetaminophen (PERCOCET/ROXICET) 5-325 MG per tablet 1 tablet (1 tablet Oral Given 03/13/21 1616)    ED Course  I have reviewed the triage vital signs and the nursing notes.  Pertinent labs & imaging results that were available during my care of the patient were reviewed by me and considered in my medical decision making (see chart for details).    MDM Rules/Calculators/A&P                           30 year old male who presents to the ED today after sustaining an injury to his ankle last night while falling off of a deck approximately 2 to 3 feet off the ground.  No head injury or loss of consciousness.  Has been unable to bear weight on his ankle since then.  On arrival to the ED today vitals are stable.  He was medically screened in the triage process and x-rays of his foot and ankle were obtained.  Patient with minimally displaced fracture of the distal malleolus.  On my exam this is consistent with  pain.  X-ray of foot stable.  We will plan for posterior splint and crutches as well as pain medication and Ortho follow-up.  Pt in agreement with plan and stable for discharge home.   This note was prepared using Dragon voice recognition software and may include unintentional dictation errors due to the inherent limitations of voice recognition software.   Final Clinical Impression(s) / ED Diagnoses Final diagnoses:  Displaced fracture of medial malleolus of left tibia, initial encounter for closed fracture    Rx / DC Orders ED Discharge Orders          Ordered    oxyCODONE-acetaminophen (PERCOCET/ROXICET) 5-325 MG tablet  Every 6 hours PRN        03/13/21 1607             Discharge Instructions      Please follow up with Dr. Carola Frost for further evaluation of your fracture. You will need to wear splint until you can be seen. DO NOT GET SPLINT WET. Use crutches to prevent baring weight onto your ankle.   While at home please rest, ice, and elevate your ankle to help reduce pain/swelling.  I would recommend 800 mg Ibuprofen every 8 hours as needed for pain and to only take the narcotic pain medication for break through pain.   Return to the ED for any new/worsening symptoms       Tanda Rockers, Cordelia Poche 03/13/21 1642    Wynetta Fines, MD 03/14/21 [phone]

## 2021-03-13 NOTE — ED Provider Notes (Signed)
Emergency Medicine Provider Triage Evaluation Note  Dale Evans , a 30 y.o. male  was evaluated in triage.  Pt complains of left heel and ankle pain.  Patient reports that he injured himself last night after falling off a deck trying to catch his grandmother.  Patient reports that he landed with his heel hard on the ground.  Patient has had pain and difficulty weightbearing since then.  Denies any numbness or weakness to affected extremity.  Review of Systems  Positive: Myalgia, arthralgia Negative: Numbness, weakness  Physical Exam  BP (!) 150/103 (BP Location: Left Arm)   Pulse 64   Temp 98.6 F (37 C) (Oral)   Resp 17   SpO2 100%  Gen:   Awake, no distress   Resp:  Normal effort  MSK:   Moves extremities without difficulty, tenderness to left lateral malleolus and left heel.  No obvious deformity or swelling noted.  +2 left dorsalis pedis pulse.  Sensation intact to all digits of left foot. Other:    Medical Decision Making  Medically screening exam initiated at 12:34 PM.  Appropriate orders placed.  Menachem D Tesoriero was informed that the remainder of the evaluation will be completed by another provider, this initial triage assessment does not replace that evaluation, and the importance of remaining in the ED until their evaluation is complete.  The patient appears stable so that the remainder of the work up may be completed by another provider.      Berneice Heinrich 03/13/21 1236    Koleen Distance, MD 03/13/21 [phone]

## 2021-03-13 NOTE — ED Triage Notes (Signed)
Pt reports L heel pain since last night.  States his grandmother was falling off a deck and when he went to catch her his heel went into the ground.  States he is unable to stand on it.

## 2021-03-13 NOTE — Progress Notes (Signed)
Orthopedic Tech Progress Note Patient Details:  BLAINE HARI [birth year] [id number]   Ortho Devices Type of Ortho Device: Post (short leg) splint, Crutches Ortho Device/Splint Location: LLE Ortho Device/Splint Interventions: Ordered, Application, Adjustment   Post Interventions Patient Tolerated: Well Instructions Provided: Care of device, Adjustment of device, Poper ambulation with device  Jeri Rawlins Carmine Savoy 03/13/2021, 6:59 PM

## 2021-03-13 NOTE — Discharge Instructions (Addendum)
Please follow up with Dr. Carola Frost for further evaluation of your fracture. You will need to wear splint until you can be seen. DO NOT GET SPLINT WET. Use crutches to prevent baring weight onto your ankle.   While at home please rest, ice, and elevate your ankle to help reduce pain/swelling.  I would recommend 800 mg Ibuprofen every 8 hours as needed for pain and to only take the narcotic pain medication for break through pain.   Return to the ED for any new/worsening symptoms

## 2021-05-14 DIAGNOSIS — L0591 Pilonidal cyst without abscess: Secondary | ICD-10-CM | POA: Insufficient documentation

## 2021-05-15 ENCOUNTER — Encounter (HOSPITAL_COMMUNITY): Payer: Self-pay | Admitting: Emergency Medicine

## 2021-05-15 ENCOUNTER — Emergency Department (HOSPITAL_COMMUNITY)
Admission: EM | Admit: 2021-05-15 | Discharge: 2021-05-15 | Disposition: A | Discharge status: Home / Self Care | Payer: Self-pay | Attending: Emergency Medicine | Admitting: Emergency Medicine

## 2021-05-15 ENCOUNTER — Other Ambulatory Visit: Payer: Self-pay

## 2021-05-15 DIAGNOSIS — L0591 Pilonidal cyst without abscess: Secondary | ICD-10-CM

## 2021-05-15 MED ORDER — SULFAMETHOXAZOLE-TRIMETHOPRIM 800-160 MG PO TABS: 1.0000 | ORAL_TABLET | Freq: Two times a day (BID) | ORAL | 0 refills | Status: AC

## 2021-05-15 MED ORDER — NAPROXEN 500 MG PO TABS
500.0000 mg | ORAL_TABLET | Freq: Two times a day (BID) | ORAL | 0 refills | Status: DC
Start: 2021-05-15 — End: 2022-06-02

## 2021-05-15 MED ORDER — SULFAMETHOXAZOLE-TRIMETHOPRIM 800-160 MG PO TABS
1.0000 | ORAL_TABLET | Freq: Once | ORAL | Status: AC
  Administered 2021-05-15: 1 via ORAL
  Filled 2021-05-15: qty 1

## 2021-05-15 MED ORDER — NAPROXEN 250 MG PO TABS
500.0000 mg | ORAL_TABLET | Freq: Once | ORAL | Status: AC
  Administered 2021-05-15: 500 mg via ORAL
  Filled 2021-05-15: qty 2

## 2021-05-15 NOTE — ED Provider Notes (Signed)
[facility]-EMERGENCY DEPT [facility] Emergency Department Provider Note MRN:  [id number]  Arrival date & time: 05/15/21     Chief Complaint   Cyst   History of Present Illness   Dale Evans is a 30 y.o. year-old male with no pertinent past medical presenting to the ED with chief complaint of cyst.  Location: Above the gluteal cleft Duration: A few years, comes and goes Onset: Gradual few days ago Timing: Constant pain Description: Ache Severity: Moderate Exacerbating/Alleviating Factors: Worse when sitting or moving Associated Symptoms: None Pertinent Negatives: Denies fever   Review of Systems  A problem-focused ROS was performed. Positive for cyst.  Patient denies fever.  Patient's Health History    Past Medical History:  Diagnosis Date   Vertigo     Past Surgical History:  Procedure Laterality Date   KNEE SURGERY     TONSILLECTOMY      Family History  Problem Relation Age of Onset   Healthy Mother    Cancer Mother    Hypertension Mother    Healthy Father    Diabetes Father    Hypertension Maternal Grandmother    Stroke Maternal Grandmother     Social History   Socioeconomic History   Marital status: Married    Spouse name: Not on file   Number of children: Not on file   Years of education: Not on file   Highest education level: Not on file  Occupational History   Not on file  Tobacco Use   Smoking status: Never   Smokeless tobacco: Never  Substance and Sexual Activity   Alcohol use: No   Drug use: Not Currently    Comment: former user of CBD last used 12/11/19   Sexual activity: Not on file  Other Topics Concern   Not on file  Social History Narrative   ** Merged History Encounter **       Social Determinants of Health   Financial Resource Strain: Not on file  Food Insecurity: Not on file  Transportation Needs: Not on file  Physical Activity: Not on file  Stress: Not on file  Social Connections: Not on file  Intimate Partner  Violence: Not on file     Physical Exam   Vitals:   05/15/21 0014  BP: (!) 163/109  Pulse: (!) 101  Resp: 20  Temp: 98.9 F (37.2 C)  SpO2: 99%    CONSTITUTIONAL: Well-appearing, NAD NEURO:  Alert and oriented x 3, no focal deficits EYES:  eyes equal and reactive ENT/NECK:  no LAD, no JVD CARDIO: Regular rate, well-perfused, normal S1 and S2 PULM:  CTAB no wheezing or rhonchi GI/GU:  normal bowel sounds, non-distended, non-tender MSK/SPINE:  No gross deformities, no edema SKIN: Small oval area of erythematous and indurated skin in the pilonidal area, no fluctuance PSYCH:  Appropriate speech and behavior  *Additional and/or pertinent findings included in MDM below  Diagnostic and Interventional Summary    EKG Interpretation  Date/Time:    Ventricular Rate:    PR Interval:    QRS Duration:   QT Interval:    QTC Calculation:   R Axis:     Text Interpretation:         Labs Reviewed - No data to display  No orders to display    Medications  sulfamethoxazole-trimethoprim (BACTRIM DS) 800-160 MG per tablet 1 tablet (has no administration in time range)  naproxen (NAPROSYN) tablet 500 mg (has no administration in time range)     Procedures  /  Critical Care Procedures  ED Course and Medical Decision Making  I have reviewed the triage vital signs, the nursing notes, and pertinent available records from the EMR.  Listed above are laboratory and imaging tests that I personally ordered, reviewed, and interpreted and then considered in my medical decision making (see below for details).  Suspect cyst infection without abscess given the lack of fluctuance, providing antibiotics, referred to general surgery.       Elmer Sow. Pilar Plate, MD [facility] Emergency Medicine [facility] [email or web address]  Final Clinical Impressions(s) / ED Diagnoses     ICD-10-CM   1. Infected pilonidal cyst  L05.91       ED Discharge Orders          Ordered     naproxen (NAPROSYN) 500 MG tablet  2 times daily        05/15/21 0428    sulfamethoxazole-trimethoprim (BACTRIM DS) 800-160 MG tablet  2 times daily        05/15/21 0428             Discharge Instructions Discussed with and Provided to Patient:    Discharge Instructions      You were evaluated in the Emergency Department and after careful evaluation, we did not find any emergent condition requiring admission or further testing in the hospital.  Your exam/testing today is overall reassuring.  Symptoms seem to be due to infection of a pilonidal cyst.  Take the Bactrim antibiotics as prescribed, use the Naprosyn anti-inflammatory for pain.  Recommend follow-up with the general surgery office for definitive management.  Please return to the Emergency Department if you experience any worsening of your condition.   Thank you for allowing [place] to be a part of your care.       Sabas Sous, MD 05/15/21 [phone]

## 2021-05-15 NOTE — Discharge Instructions (Addendum)
You were evaluated in the Emergency Department and after careful evaluation, we did not find any emergent condition requiring admission or further testing in the hospital.  Your exam/testing today is overall reassuring.  Symptoms seem to be due to infection of a pilonidal cyst.  Take the Bactrim antibiotics as prescribed, use the Naprosyn anti-inflammatory for pain.  Recommend follow-up with the general surgery office for definitive management.  Please return to the Emergency Department if you experience any worsening of your condition.   Thank you for allowing [place] to be a part of your care.

## 2021-05-15 NOTE — ED Triage Notes (Signed)
Pt reports cyst on bottom is causing pain.  Has had it before but "not this bad."

## 2021-06-25 ENCOUNTER — Emergency Department (HOSPITAL_COMMUNITY)
Admission: EM | Admit: 2021-06-25 | Discharge: 2021-06-26 | Disposition: A | Discharge status: Home / Self Care | Payer: Self-pay | Attending: Emergency Medicine | Admitting: Emergency Medicine

## 2021-06-25 ENCOUNTER — Encounter (HOSPITAL_COMMUNITY): Payer: Self-pay | Admitting: *Deleted

## 2021-06-25 ENCOUNTER — Other Ambulatory Visit: Payer: Self-pay

## 2021-06-25 DIAGNOSIS — L0501 Pilonidal cyst with abscess: Secondary | ICD-10-CM | POA: Insufficient documentation

## 2021-06-25 NOTE — ED Triage Notes (Signed)
Pt states tx for cyst on back last week that seemed to improve, but is now worse.

## 2021-06-25 NOTE — ED Provider Notes (Signed)
Emergency Medicine Provider Triage Evaluation Note  Dale Evans , a 30 y.o. male  was evaluated in triage.  Pt complains of recurrent cyst on his buttocks.  States this is a recurrent issue for him-- went away recently but now has returned and is bigger.  Denies fever.  Review of Systems  Positive: cyst Negative: Fever, chills  Physical Exam  BP (!) 169/91 (BP Location: Right Arm)   Pulse 94   Temp 99.6 F (37.6 C) (Oral)   Resp 16   SpO2 98%   Gen:   Awake, no distress   Resp:  Normal effort  MSK:   Moves extremities without difficulty  Other:  Pilonidal cyst noted to right gluteal cleft, no erythema noted, no drainage/bleeding  Medical Decision Making  Medically screening exam initiated at 10:53 PM.  Appropriate orders placed.  Dale Evans was informed that the remainder of the evaluation will be completed by another provider, this initial triage assessment does not replace that evaluation, and the importance of remaining in the ED until their evaluation is complete.  Pilonidal cyst   Garlon Hatchet, PA-C 06/25/21 2302    Nira Conn, MD 06/26/21 [phone]

## 2021-06-26 MED ORDER — BUPIVACAINE HCL (PF) 0.5 % IJ SOLN
20.0000 mL | Freq: Once | INTRAMUSCULAR | Status: AC
  Administered 2021-06-26: 10 mL
  Filled 2021-06-26: qty 20

## 2021-06-26 MED ORDER — DOXYCYCLINE HYCLATE 100 MG PO CAPS
100.0000 mg | ORAL_CAPSULE | Freq: Two times a day (BID) | ORAL | 0 refills | Status: AC
Start: 2021-06-26 — End: 2021-07-03

## 2021-06-26 NOTE — Discharge Instructions (Addendum)
Please come back in 48 hours to have your pilonidal abscess wound rechecked.  You may shower but do not directly wash the area of the abscess.  Do not soak in water.  You may take Tylenol and ibuprofen for pain as discussed below. You will ultimately need to follow-up with general surgery.  Please use Tylenol or ibuprofen for pain.  You may use 600 mg ibuprofen every 6 hours or 1000 mg of Tylenol every 6 hours.  You may choose to alternate between the 2.  This would be most effective.  Not to exceed 4 g of Tylenol within 24 hours.  Not to exceed 3200 mg ibuprofen 24 hours.   Take antibiotics for the entire course as prescribed.

## 2021-06-26 NOTE — ED Provider Notes (Signed)
[facility] EMERGENCY DEPARTMENT Provider Note   CSN: [id number] Arrival date & time: 06/25/21  2200     History Chief Complaint  Patient presents with   Cyst    Dale Evans is a 30 y.o. male.  HPI Patient is a 30 year old male presented today with pilonidal cyst.  He was seen approximately 1 month ago for similar.  States that it has become more painful is now red and somewhat swollen.  He states he is taking antibiotics as prescribed.  Has not had improvement in the symptoms.  No systemic symptoms denies any fevers chills nausea vomiting chest pain shortness breath lightness or dizziness.  States the pain is achy constant occasionally sharp worse with sitting.  States is located on the right side of his buttocks.  Denies any pain with defecation denies any dysuria frequency urgency.  No other associate symptoms.  Aggravating factors were from those discussed above.    Past Medical History:  Diagnosis Date   Vertigo     There are no problems to display for this patient.   Past Surgical History:  Procedure Laterality Date   KNEE SURGERY     TONSILLECTOMY         Family History  Problem Relation Age of Onset   Healthy Mother    Cancer Mother    Hypertension Mother    Healthy Father    Diabetes Father    Hypertension Maternal Grandmother    Stroke Maternal Grandmother     Social History   Tobacco Use   Smoking status: Never   Smokeless tobacco: Never  Substance Use Topics   Alcohol use: No   Drug use: Not Currently    Comment: former user of CBD last used 12/11/19    Home Medications Prior to Admission medications   Medication Sig Start Date End Date Taking? Authorizing Provider  doxycycline (VIBRAMYCIN) 100 MG capsule Take 1 capsule (100 mg total) by mouth 2 (two) times daily for 7 days. 06/26/21 07/03/21 Yes Keidy Thurgood, Rodrigo Ran, PA  ALPRAZolam (XANAX) 0.5 MG tablet Take 1 tablet (0.5 mg total) by mouth as needed for anxiety. 01/19/20    Dohmeier, Porfirio Mylar, MD  diphenhydrAMINE (BENADRYL) 25 MG tablet Take 1 tablet (25 mg total) by mouth every 6 (six) hours as needed. 01/07/20   Arby Barrette, MD  ibuprofen (ADVIL) 600 MG tablet Take 1 tablet (600 mg total) by mouth every 6 (six) hours as needed. 03/20/19   Derwood Kaplan, MD  meclizine (ANTIVERT) 50 MG tablet Take 1 tablet (50 mg total) by mouth 3 (three) times daily as needed. 01/05/20   Ward, Layla Maw, DO  naproxen (NAPROSYN) 500 MG tablet Take 1 tablet (500 mg total) by mouth 2 (two) times daily. 05/15/21   Sabas Sous, MD  oxyCODONE-acetaminophen (PERCOCET/ROXICET) 5-325 MG tablet Take 1 tablet by mouth every 6 (six) hours as needed for severe pain. 03/13/21   Tanda Rockers, PA-C    Allergies    Licorice flavor [artificial licorice flavor], Amoxicillin, and Amoxicillin  Review of Systems   Review of Systems  Constitutional:  Negative for chills and fever.  HENT:  Negative for congestion.   Eyes:  Negative for pain.  Respiratory:  Negative for cough and shortness of breath.   Cardiovascular:  Negative for chest pain and leg swelling.  Gastrointestinal:  Negative for abdominal pain and vomiting.  Genitourinary:  Negative for dysuria.  Musculoskeletal:  Negative for myalgias.  Skin:  Negative for rash.  Cyst  Neurological:  Negative for dizziness and headaches.   Physical Exam Updated Vital Signs BP (!) 169/105 (BP Location: Left Arm)   Pulse 86   Temp 98.6 F (37 C) (Oral)   Resp 20   SpO2 100%   Physical Exam Vitals and nursing note reviewed.  Constitutional:      General: He is not in acute distress.    Appearance: Normal appearance. He is not ill-appearing.  HENT:     Head: Normocephalic and atraumatic.  Eyes:     General: No scleral icterus.       Right eye: No discharge.        Left eye: No discharge.     Conjunctiva/sclera: Conjunctivae normal.  Pulmonary:     Effort: Pulmonary effort is normal.     Breath sounds: No stridor.  Skin:     General: Skin is warm and dry.     Comments: Right-sided pilonidal abscess approximately 5 cm x 3 cm vertically oriented oval-shaped lesion.  Neurological:     Mental Status: He is alert and oriented to person, place, and time. Mental status is at baseline.    ED Results / Procedures / Treatments   Labs (all labs ordered are listed, but only abnormal results are displayed) Labs Reviewed - No data to display  EKG None  Radiology No results found.  Procedures Ultrasound ED Soft Tissue  Date/Time: 06/27/2021 7:21 AM Performed by: Gailen Shelter, PA Authorized by: Gailen Shelter, PA   Procedure details:    Indications: localization of abscess     Transverse view:  Visualized   Longitudinal view:  Visualized   Images: not archived     Limitations:  Body habitus Location:    Location: buttocks     Side:  Right Findings:     abscess present .Marland KitchenIncision and Drainage  Date/Time: 06/27/2021 7:22 AM Performed by: Gailen Shelter, PA Authorized by: Gailen Shelter, PA   Consent:    Consent obtained:  Verbal   Consent given by:  Patient   Risks discussed:  Bleeding, incomplete drainage, pain and damage to other organs   Alternatives discussed:  No treatment Universal protocol:    Procedure explained and questions answered to patient or proxy's satisfaction: yes     Relevant documents present and verified: yes     Test results available : yes     Imaging studies available: yes     Required blood products, implants, devices, and special equipment available: yes     Site/side marked: yes     Immediately prior to procedure, a time out was called: yes     Patient identity confirmed:  Verbally with patient Location:    Type:  Abscess   Size:  5x3 cm   Location:  Anogenital   Anogenital location: Pilonidal abscess. Pre-procedure details:    Skin preparation:  Betadine Anesthesia:    Anesthesia method:  Local infiltration   Local anesthetic:  Lidocaine 1% WITH  epi Procedure type:    Complexity:  Complex Procedure details:    Incision types:  Single straight   Incision depth:  Subcutaneous   Wound management:  Probed and deloculated, irrigated with saline and extensive cleaning   Drainage:  Purulent   Drainage amount:  Copious (Copious brown purulent fluid)   Packing materials:  1/4 in gauze   Amount 1/4":  Large amount Post-procedure details:    Procedure completion:  Tolerated well, no immediate complications   Medications Ordered in  ED Medications  bupivacaine (MARCAINE) 0.5 % injection 20 mL (10 mLs Infiltration Given 06/26/21 1023)    ED Course  I have reviewed the triage vital signs and the nursing notes.  Pertinent labs & imaging results that were available during my care of the patient were reviewed by me and considered in my medical decision making (see chart for details).    MDM Rules/Calculators/A&P                          Patient is well-appearing 30 year old male seems to have had a pilonidal cyst for quite some time seems to have developed a pilonidal abscess  He has no systemic symptoms is uncomfortable but well-appearing on exam nontoxic.  Has a 5 x 3 cm abscess in the pilonidal cyst on the right side of his gluteal cleft.  Confirmed with bedside ultrasound.  Incision and drainage was completed patient tolerated procedure well after nerve block with regional lidocaine administration/regional block  Copious purulent drainage expressed.  Large amount of packing was placed due to the size of the pocket.  Patient will follow up in 2 days with either PCP or return to ER for wound check.  Prescribed doxycycline.   Final Clinical Impression(s) / ED Diagnoses Final diagnoses:  Pilonidal abscess    Rx / DC Orders ED Discharge Orders          Ordered    doxycycline (VIBRAMYCIN) 100 MG capsule  2 times daily        06/26/21 [address] [place], [place] 06/27/21 0724    Margarita Grizzle, MD 07/01/21  [phone]

## 2021-06-26 NOTE — ED Notes (Signed)
Cleaned and dressed  denies pain but states pressure,  family brought Inand given dressing instructions

## 2021-07-01 ENCOUNTER — Other Ambulatory Visit: Payer: Self-pay

## 2021-07-01 ENCOUNTER — Emergency Department (HOSPITAL_COMMUNITY)
Admission: EM | Admit: 2021-07-01 | Discharge: 2021-07-02 | Disposition: A | Discharge status: Home / Self Care | Payer: Self-pay | Attending: Emergency Medicine | Admitting: Emergency Medicine

## 2021-07-01 ENCOUNTER — Encounter (HOSPITAL_COMMUNITY): Payer: Self-pay | Admitting: Emergency Medicine

## 2021-07-01 DIAGNOSIS — Z4800 Encounter for change or removal of nonsurgical wound dressing: Secondary | ICD-10-CM | POA: Insufficient documentation

## 2021-07-01 DIAGNOSIS — Z5189 Encounter for other specified aftercare: Secondary | ICD-10-CM

## 2021-07-01 NOTE — ED Triage Notes (Signed)
Patient requesting wound check on his cyst that was incised/drained and packed last Sunday with drainage .

## 2021-07-01 NOTE — ED Provider Notes (Signed)
Emergency Medicine Provider Triage Evaluation Note  Dale Evans , a 30 y.o. male  was evaluated in triage.  Pt complains of recheck for pilonidal cyst that was drained.  He had packing placed and was supposed to be removed two days after (the 17th) but says he got stuck at work.  He denies fevers. He reports less drainage.   Review of Systems  Positive: wound Negative: fevers  Physical Exam  BP (!) 171/100 (BP Location: Right Arm)   Pulse 75   Temp 98.4 F (36.9 C) (Oral)   Resp 19   Ht 6\' 3"  (1.905 m)   Wt (!) 175 kg   SpO2 98%   BMI 48.22 kg/m  Gen:   Awake, no distress   Resp:  Normal effort  MSK:   Moves extremities without difficulty  Other:   Wound inspection deferred due to triage setting.  Medical Decision Making  Medically screening exam initiated at 8:13 PM.  Appropriate orders placed.  Dale Evans was informed that the remainder of the evaluation will be completed by another provider, this initial triage assessment does not replace that evaluation, and the importance of remaining in the ED until their evaluation is complete.  Note: Portions of this report may have been transcribed using voice recognition software. Every effort was made to ensure accuracy; however, inadvertent computerized transcription errors may be present    07/01/21 2017    2018, MD 07/01/21 2257

## 2021-07-02 NOTE — ED Provider Notes (Signed)
[facility] EMERGENCY DEPARTMENT Provider Note   CSN: [id number] Arrival date & time: 07/01/21  1839     History Chief Complaint  Patient presents with   Cyst    Dale Evans is a 30 y.o. male who presents to the emergency department for a wound check.  The patient was seen in the ER on October 15 and underwent I&D with packing placed for a pilonidal abscess.  He was discharged on antibiotics.  He was advised to return in 3 days for a wound recheck, but has been busy with work and has been unable to follow-up for reevaluation.  The packing from his last ER visit has remained in place.  He has continue to dress the wound daily.  He does report that he has continued to notice some irritation around the wound, but discomfort has not been worsening.  Discharged from the wound has been decreasing.  He denies rectal pain, fever, chills, purulent discharge, back pain, worsening redness, swelling, or warmth around the wound.  No known aggravating or alleviating factors.  The history is provided by medical records and the patient. No language interpreter was used.      Past Medical History:  Diagnosis Date   Vertigo     There are no problems to display for this patient.   Past Surgical History:  Procedure Laterality Date   KNEE SURGERY     TONSILLECTOMY         Family History  Problem Relation Age of Onset   Healthy Mother    Cancer Mother    Hypertension Mother    Healthy Father    Diabetes Father    Hypertension Maternal Grandmother    Stroke Maternal Grandmother     Social History   Tobacco Use   Smoking status: Never   Smokeless tobacco: Never  Substance Use Topics   Alcohol use: No   Drug use: Not Currently    Comment: former user of CBD last used 12/11/19    Home Medications Prior to Admission medications   Medication Sig Start Date End Date Taking? Authorizing Provider  ALPRAZolam Prudy Feeler) 0.5 MG tablet Take 1 tablet (0.5 mg total) by mouth  as needed for anxiety. 01/19/20   Dohmeier, Porfirio Mylar, MD  diphenhydrAMINE (BENADRYL) 25 MG tablet Take 1 tablet (25 mg total) by mouth every 6 (six) hours as needed. 01/07/20   Arby Barrette, MD  doxycycline (VIBRAMYCIN) 100 MG capsule Take 1 capsule (100 mg total) by mouth 2 (two) times daily for 7 days. 06/26/21 07/03/21  Gailen Shelter, PA  ibuprofen (ADVIL) 600 MG tablet Take 1 tablet (600 mg total) by mouth every 6 (six) hours as needed. 03/20/19   Derwood Kaplan, MD  meclizine (ANTIVERT) 50 MG tablet Take 1 tablet (50 mg total) by mouth 3 (three) times daily as needed. 01/05/20   Ward, Layla Maw, DO  naproxen (NAPROSYN) 500 MG tablet Take 1 tablet (500 mg total) by mouth 2 (two) times daily. 05/15/21   Sabas Sous, MD  oxyCODONE-acetaminophen (PERCOCET/ROXICET) 5-325 MG tablet Take 1 tablet by mouth every 6 (six) hours as needed for severe pain. 03/13/21   Tanda Rockers, PA-C    Allergies    Licorice flavor [artificial licorice flavor], Amoxicillin, and Amoxicillin  Review of Systems   Review of Systems  Constitutional:  Negative for activity change, chills, diaphoresis and fever.  Respiratory:  Negative for shortness of breath.   Cardiovascular:  Negative for chest pain.  Gastrointestinal:  Negative for abdominal pain, constipation, diarrhea, nausea and vomiting.  Musculoskeletal:  Negative for arthralgias, back pain and myalgias.  Skin:  Positive for wound. Negative for color change and rash.  Neurological:  Negative for seizures, syncope, weakness and numbness.   Physical Exam Updated Vital Signs BP (!) 158/102 (BP Location: Right Arm)   Pulse 66   Temp 98.4 F (36.9 C) (Oral)   Resp 18   Ht 6\' 3"  (1.905 m)   Wt (!) 175 kg   SpO2 99%   BMI 48.22 kg/m   Physical Exam Vitals and nursing note reviewed.  Constitutional:      Appearance: He is well-developed.  HENT:     Head: Normocephalic.  Eyes:     Conjunctiva/sclera: Conjunctivae normal.  Cardiovascular:     Rate  and Rhythm: Normal rate and regular rhythm.     Heart sounds: No murmur heard. Pulmonary:     Effort: Pulmonary effort is normal.  Abdominal:     General: There is no distension.     Palpations: Abdomen is soft. There is no mass.     Tenderness: There is no abdominal tenderness. There is no right CVA tenderness, left CVA tenderness, guarding or rebound.     Hernia: No hernia is present.  Musculoskeletal:     Cervical back: Neck supple.     Comments: Vertical incision located just to the right of the gluteal cleft.  Packing material was removed with a small amount of sanguinous discharge.  No purulent discharge.  There is no surrounding erythema, edema, warmth, red streaking.  No tenderness to palpation.  Skin:    General: Skin is warm and dry.  Neurological:     Mental Status: He is alert.  Psychiatric:        Behavior: Behavior normal.    ED Results / Procedures / Treatments   Labs (all labs ordered are listed, but only abnormal results are displayed) Labs Reviewed - No data to display  EKG None  Radiology No results found.  Procedures Procedures   Medications Ordered in ED Medications - No data to display  ED Course  I have reviewed the triage vital signs and the nursing notes.  Pertinent labs & imaging results that were available during my care of the patient were reviewed by me and considered in my medical decision making (see chart for details).    MDM Rules/Calculators/A&P                           30 year old male who presents emergency department for a wound recheck.  He underwent I&D for pilonidal abscess 1 week ago.  Vital signs are stable.  On my evaluation, packing material is in place.  He has continued to endorse discomfort to the wound, but reports that this resolved as soon as the packing material was removed from the wound, which has been in place for the last week.  No evidence of purulent drainage.  No surrounding abscess or cellulitis.  Doubt  Fournier's gangrene or deep space infection.  Recommended continued home wound care.  Wound care supplies given.  He is hemodynamically stable in no acute distress.  Safer discharge home with outpatient follow-up as discussed  Final Clinical Impression(s) / ED Diagnoses Final diagnoses:  Wound check, abscess    Rx / DC Orders ED Discharge Orders     None        Zakaria Fromer A, PA-C 07/02/21 0204  Palumbo, April, MD 07/02/21 [phone]

## 2021-07-02 NOTE — Discharge Instructions (Addendum)
Thank you for allowing me to care for you today in the Emergency Department.   Continue to clean the wound daily by letting warm water and soap run over it.  Pat the area dry.  You can then apply a gauze dressing and cover with the tape dressing.  Repeat this daily until the wound is closed.  You can change the dressing more than once if it becomes saturated.  Make sure to keep it covered until the wound closes.  Avoid hot tubs, swimming pools, or saunas where the wound may be submerged in water until it closes.  You can follow-up with [place] and wellness if the wound has not closed in the next week and 1/2 to 2 weeks.  Return to the emergency department if you you start having fevers, temperature of 100.4 F or higher, chills, if the area gets red, hot to the touch, swollen, if you start having significantly worsening thick, foul-smelling drainage, severe rectal pain, or other new, concerning symptoms.

## 2021-12-28 ENCOUNTER — Encounter (HOSPITAL_COMMUNITY): Payer: Self-pay

## 2021-12-28 ENCOUNTER — Ambulatory Visit (HOSPITAL_COMMUNITY)
Admission: EM | Admit: 2021-12-28 | Discharge: 2021-12-28 | Disposition: A | Discharge status: Home / Self Care | Payer: Self-pay | Attending: Nurse Practitioner | Admitting: Nurse Practitioner

## 2021-12-28 DIAGNOSIS — S0502XA Injury of conjunctiva and corneal abrasion without foreign body, left eye, initial encounter: Secondary | ICD-10-CM

## 2021-12-28 MED ORDER — TOBRAMYCIN-DEXAMETHASONE 0.3-0.1 % OP SUSP: 1.0000 [drp] | OPHTHALMIC | 0 refills | Status: AC

## 2021-12-28 MED ORDER — FLUORESCEIN SODIUM 1 MG OP STRP
ORAL_STRIP | OPHTHALMIC | Status: AC
  Filled 2021-12-28: qty 1

## 2021-12-28 MED ORDER — TETRACAINE HCL 0.5 % OP SOLN
OPHTHALMIC | Status: AC
  Filled 2021-12-28: qty 4

## 2021-12-28 NOTE — ED Triage Notes (Signed)
Pt presents with left eye pain that began this morning ?

## 2021-12-28 NOTE — Discharge Instructions (Signed)
Your exam today shows that you have a corneal abrasion in the left eye. ?Keep the left eye clean and dry.  Do not rub or manipulate the eye while symptoms persist. ?Cool compresses to the affected eye for pain or discomfort. ?Use medication as prescribed. ?Follow-up within the next 24 hours if your symptoms do not improve, or if they worsen. ? ?

## 2021-12-28 NOTE — ED Provider Notes (Signed)
?[facility] ? ? ? ?CSN: [id number] ?Arrival date & time: 12/28/21  [id number] ? ? ?  ? ?History   ?Chief Complaint ?Chief Complaint  ?Patient presents with  ? Eye Pain  ? ? ?HPI ?Dale Evans is a 31 y.o. male.  ? ?A 31 year old male who presents with left eye pain.  Patient states symptoms were present when he woke up this morning.  He states that he had difficulty opening his eye, pain in the eye, and clear drainage.  He denies any injury, trauma, recent infection, use of contacts or glasses.  He states that he does have blurred vision and photophobia in the left eye, and difficulty keeping the eye open.  States that he got up this morning and used some eyedrops with out relief. ? ?The history is provided by the patient.  ? ?Past Medical History:  ?Diagnosis Date  ? Vertigo   ? ? ?There are no problems to display for this patient. ? ? ?Past Surgical History:  ?Procedure Laterality Date  ? KNEE SURGERY    ? TONSILLECTOMY    ? ? ? ? ? ?Home Medications   ? ?Prior to Admission medications   ?Medication Sig Start Date End Date Taking? Authorizing Provider  ?tobramycin-dexamethasone Wallene Dales(TOBRADEX) ophthalmic solution Place 1 drop into the left eye every 4 (four) hours while awake for 7 days. 12/28/21 01/04/22 Yes Canisha Issac-Warren, Sadie Haberhristie J, NP  ?ALPRAZolam (XANAX) 0.5 MG tablet Take 1 tablet (0.5 mg total) by mouth as needed for anxiety. ?Patient not taking: Reported on 12/28/2021 01/19/20   Dohmeier, Porfirio Mylararmen, MD  ?diphenhydrAMINE (BENADRYL) 25 MG tablet Take 1 tablet (25 mg total) by mouth every 6 (six) hours as needed. ?Patient not taking: Reported on 12/28/2021 01/07/20   Arby BarrettePfeiffer, Marcy, MD  ?ibuprofen (ADVIL) 600 MG tablet Take 1 tablet (600 mg total) by mouth every 6 (six) hours as needed. ?Patient not taking: Reported on 12/28/2021 03/20/19   Derwood KaplanNanavati, Ankit, MD  ?meclizine (ANTIVERT) 50 MG tablet Take 1 tablet (50 mg total) by mouth 3 (three) times daily as needed. ?Patient not taking: Reported on 12/28/2021 01/05/20    Ward, Layla MawKristen N, DO  ?naproxen (NAPROSYN) 500 MG tablet Take 1 tablet (500 mg total) by mouth 2 (two) times daily. ?Patient not taking: Reported on 12/28/2021 05/15/21   Sabas SousBero, Michael M, MD  ?oxyCODONE-acetaminophen (PERCOCET/ROXICET) 5-325 MG tablet Take 1 tablet by mouth every 6 (six) hours as needed for severe pain. ?Patient not taking: Reported on 12/28/2021 03/13/21   Tanda RockersVenter, Margaux, PA-C  ? ? ?Family History ?Family History  ?Problem Relation Age of Onset  ? Healthy Mother   ? Cancer Mother   ? Hypertension Mother   ? Healthy Father   ? Diabetes Father   ? Hypertension Maternal Grandmother   ? Stroke Maternal Grandmother   ? ? ?Social History ?Social History  ? ?Tobacco Use  ? Smoking status: Never  ? Smokeless tobacco: Never  ?Substance Use Topics  ? Alcohol use: No  ? Drug use: Not Currently  ?  Comment: former user of CBD last used 12/11/19  ? ? ? ?Allergies   ?Licorice flavor [artificial licorice flavor], Amoxicillin, and Amoxicillin ? ? ?Review of Systems ?Review of Systems  ?Constitutional: Negative.   ?Eyes:  Positive for photophobia, pain, discharge, redness and visual disturbance.  ?Respiratory: Negative.    ?Cardiovascular: Negative.   ?Skin: Negative.   ?Psychiatric/Behavioral: Negative.    ? ? ?Physical Exam ?Triage Vital Signs ?ED Triage Vitals  ?Enc  Vitals Group  ?   BP 12/28/21 1001 (!) 169/123  ?   Pulse Rate 12/28/21 1001 67  ?   Resp 12/28/21 1001 14  ?   Temp 12/28/21 1001 98.2 ?F (36.8 ?C)  ?   Temp Source 12/28/21 1001 Oral  ?   SpO2 12/28/21 1001 97 %  ?   Weight --   ?   Height --   ?   Head Circumference --   ?   Peak Flow --   ?   Pain Score 12/28/21 1005 5  ?   Pain Loc --   ?   Pain Edu? --   ?   Excl. in GC? --   ? ?No data found. ? ?Updated Vital Signs ?BP (!) 169/123 (BP Location: Left Arm) Comment: No hx of HTN  Pulse 67   Temp 98.2 ?F (36.8 ?C) (Oral)   Resp 14   SpO2 97%  ? ?Visual Acuity ?Right Eye Distance: 20/15 ?Left Eye Distance: 20/200 ?Bilateral Distance: 20/20 ? ?Right Eye  Near:   ?Left Eye Near:    ?Bilateral Near:    ? ?Physical Exam ?Vitals reviewed.  ?Constitutional:   ?   General: He is not in acute distress. ?   Appearance: Normal appearance.  ?HENT:  ?   Head: Normocephalic.  ?   Right Ear: Tympanic membrane, ear canal and external ear normal.  ?   Left Ear: Tympanic membrane, ear canal and external ear normal.  ?   Nose: Nose normal.  ?   Mouth/Throat:  ?   Mouth: Mucous membranes are moist.  ?Eyes:  ?   General: Lids are normal. Lids are everted, no foreign bodies appreciated. Visual field deficit present.     ?   Right eye: No foreign body.     ?   Left eye: Discharge (clear) present.No foreign body or hordeolum.  ?   Extraocular Movements: Extraocular movements intact.  ?   Left eye: Normal extraocular motion.  ?   Conjunctiva/sclera: Conjunctivae normal.  ?   Pupils: Pupils are equal, round, and reactive to light.  ?   Comments: Left eye with clear drainage and redness.  Fluorescein stain done.  Uptake was seen at the 1 o'clock position in the left eye.  No foreign object seen with the lids everted.  ?Cardiovascular:  ?   Rate and Rhythm: Normal rate and regular rhythm.  ?   Pulses: Normal pulses.  ?   Heart sounds: Normal heart sounds.  ?Pulmonary:  ?   Effort: Pulmonary effort is normal.  ?   Breath sounds: Normal breath sounds. No stridor.  ?Musculoskeletal:  ?   Cervical back: Normal range of motion.  ?Skin: ?   General: Skin is warm and dry.  ?   Capillary Refill: Capillary refill takes less than 2 seconds.  ?Neurological:  ?   General: No focal deficit present.  ?   Mental Status: He is alert and oriented to person, place, and time.  ?Psychiatric:     ?   Mood and Affect: Mood normal.     ?   Behavior: Behavior normal.  ? ? ? ?UC Treatments / Results  ?Labs ?(all labs ordered are listed, but only abnormal results are displayed) ?Labs Reviewed - No data to display ? ?EKG ? ? ?Radiology ?No results found. ? ?Procedures ?Procedures (including critical care  time) ? ?Medications Ordered in UC ?Medications - No data to display ? ?Initial Impression / Assessment  and Plan / UC Course  ?I have reviewed the triage vital signs and the nursing notes. ? ?Pertinent labs & imaging results that were available during my care of the patient were reviewed by me and considered in my medical decision making (see chart for details). ? ?The patient is a 31 year old male who presents with left eye pain.  Symptoms started upon awakening today.  Fluorescein stain was done which was positive for corneal abrasion at the 1 o'clock position in the left eye.  TobraDex was prescribed to help with the inflammation and possible infection.  Patient advised to perform supportive care at home to include cool compresses, avoiding rubbing or manipulating the eye while symptoms persist, and ibuprofen as needed.  Patient was advised to follow-up in 24 hours if his symptoms worsen or do not improve. ?Final Clinical Impressions(s) / UC Diagnoses  ? ?Final diagnoses:  ?Abrasion of left cornea, initial encounter  ? ? ? ?Discharge Instructions   ? ?  ?Your exam today shows that you have a corneal abrasion in the left eye. ?Keep the left eye clean and dry.  Do not rub or manipulate the eye while symptoms persist. ?Cool compresses to the affected eye for pain or discomfort. ?Use medication as prescribed. ?Follow-up within the next 24 hours if your symptoms do not improve, or if they worsen. ? ? ? ? ? ?ED Prescriptions   ? ? Medication Sig Dispense Auth. Provider  ? tobramycin-dexamethasone Wallene Dales) ophthalmic solution Place 1 drop into the left eye every 4 (four) hours while awake for 7 days. 5 mL Chesnie Capell-Warren, Sadie Haber, NP  ? ?  ? ?PDMP not reviewed this encounter. ?  ?Abran Cantor, NP ?12/28/21 1053 ? ?

## 2022-06-02 ENCOUNTER — Ambulatory Visit (HOSPITAL_COMMUNITY)
Admission: EM | Admit: 2022-06-02 | Discharge: 2022-06-02 | Disposition: A | Discharge status: Home / Self Care | Payer: Self-pay | Attending: Physician Assistant | Admitting: Physician Assistant

## 2022-06-02 ENCOUNTER — Encounter (HOSPITAL_COMMUNITY): Payer: Self-pay

## 2022-06-02 DIAGNOSIS — L0501 Pilonidal cyst with abscess: Secondary | ICD-10-CM

## 2022-06-02 MED ORDER — SULFAMETHOXAZOLE-TRIMETHOPRIM 800-160 MG PO TABS: 1.0000 | ORAL_TABLET | Freq: Two times a day (BID) | ORAL | 0 refills | Status: AC

## 2022-06-02 MED ORDER — MUPIROCIN 2 % EX OINT: 1.0000 | TOPICAL_OINTMENT | Freq: Two times a day (BID) | CUTANEOUS | 0 refills | Status: DC

## 2022-06-02 MED ORDER — LIDOCAINE-EPINEPHRINE 1 %-1:100000 IJ SOLN
INTRAMUSCULAR | Status: AC
  Filled 2022-06-02: qty 1

## 2022-06-02 NOTE — Discharge Instructions (Signed)
We did not pack this area.  It is important that you use warm compresses and keep the area clean to prevent it from closing up and reaccumulating.  Use Bactroban ointment with dressing changes 2-3 times per day.  Started Bactrim DS.  If you develop any rash or lesions stop the medication to be seen immediately.  Use Tylenol and ibuprofen for pain relief.  Once this is healed I do recommend you follow-up with the surgeon as he will likely need to have this removed in order to prevent it from occurring.  Call them to schedule an appointment.  If anything worsens and you have fever, nausea, vomiting, increased pain you need to go to the emergency room immediately.

## 2022-06-02 NOTE — ED Triage Notes (Signed)
Pt is here for a recurrent cyst on the buttocks  x3days that has already started to drain.

## 2022-06-02 NOTE — ED Provider Notes (Addendum)
[place]    CSN: [id number] Arrival date & time: 06/02/22  7564      History   Chief Complaint Chief Complaint  Patient presents with   Abscess    HPI Dale Evans is a 31 y.o. male.   Patient presents today with 3-day history of enlarging painful lesion on the superior portion of his buttocks.  He has a history of pilonidal cyst and has had to have these removed.  Reports that he underwent an extensive procedure in the emergency room in October 2022 and has not had recurrent episodes until today.  He will occasionally have some discomfort but generally this resolves on its own.  He denies any recent antibiotics.  Denies history of diabetes or immunosuppression.  Denies any fever, nausea, vomiting.  Reports pain is rated 8 on a 0-10 pain scale, described as pressure, no alleviating factors identified.    Past Medical History:  Diagnosis Date   Vertigo     There are no problems to display for this patient.   Past Surgical History:  Procedure Laterality Date   KNEE SURGERY     TONSILLECTOMY         Home Medications    Prior to Admission medications   Medication Sig Start Date End Date Taking? Authorizing Provider  mupirocin ointment (BACTROBAN) 2 % Apply 1 Application topically 2 (two) times daily. 06/02/22  Yes Romario Tith K, PA-C  sulfamethoxazole-trimethoprim (BACTRIM DS) 800-160 MG tablet Take 1 tablet by mouth 2 (two) times daily for 7 days. 06/02/22 06/09/22 Yes Rozanna Cormany, Derry Skill, PA-C  diphenhydrAMINE (BENADRYL) 25 MG tablet Take 1 tablet (25 mg total) by mouth every 6 (six) hours as needed. Patient not taking: Reported on 12/28/2021 01/07/20   Charlesetta Shanks, MD    Family History Family History  Problem Relation Age of Onset   Healthy Mother    Cancer Mother    Hypertension Mother    Healthy Father    Diabetes Father    Hypertension Maternal Grandmother    Stroke Maternal Grandmother     Social History Social History   Tobacco Use    Smoking status: Never   Smokeless tobacco: Never  Substance Use Topics   Alcohol use: No   Drug use: Not Currently    Comment: former user of CBD last used 12/11/19     Allergies   Licorice flavor [artificial licorice flavor], Amoxicillin, and Amoxicillin   Review of Systems Review of Systems  Constitutional:  Positive for activity change. Negative for appetite change, fatigue and fever.  Gastrointestinal:  Negative for abdominal pain, diarrhea, nausea and vomiting.  Musculoskeletal:  Negative for arthralgias and myalgias.  Skin:  Positive for color change and wound.     Physical Exam Triage Vital Signs ED Triage Vitals  Enc Vitals Group     BP 06/02/22 1044 (!) 165/104     Pulse Rate 06/02/22 1044 72     Resp 06/02/22 1044 12     Temp 06/02/22 1044 98.3 F (36.8 C)     Temp Source 06/02/22 1044 Oral     SpO2 06/02/22 1044 98 %     Weight 06/02/22 1043 (!) 350 lb (158.8 kg)     Height 06/02/22 1043 6\' 2"  (1.88 m)     Head Circumference --      Peak Flow --      Pain Score 06/02/22 1042 8     Pain Loc --      Pain Edu? --  Excl. in GC? --    No data found.  Updated Vital Signs BP (!) 165/104 (BP Location: Left Arm)   Pulse 72   Temp 98.3 F (36.8 C) (Oral)   Resp 12   Ht 6\' 2"  (1.88 m)   Wt (!) 350 lb (158.8 kg)   SpO2 98%   BMI 44.94 kg/m   Visual Acuity Right Eye Distance:   Left Eye Distance:   Bilateral Distance:    Right Eye Near:   Left Eye Near:    Bilateral Near:     Physical Exam Vitals reviewed.  Constitutional:      General: He is awake.     Appearance: Normal appearance. He is well-developed. He is not ill-appearing.     Comments: Very pleasant male appears stated age in no acute distress  HENT:     Head: Normocephalic and atraumatic.  Pulmonary:     Effort: Pulmonary effort is normal. No accessory muscle usage or respiratory distress.  Skin:    Findings: Abscess present.       Neurological:     Mental Status: He is alert.   Psychiatric:        Behavior: Behavior is cooperative.      UC Treatments / Results  Labs (all labs ordered are listed, but only abnormal results are displayed) Labs Reviewed - No data to display  EKG   Radiology No results found.  Procedures Incision and Drainage  Date/Time: 06/02/2022 12:16 PM  Performed by: 06/04/2022, PA-C Authorized by: Jeani Hawking, PA-C   Consent:    Consent obtained:  Verbal   Consent given by:  Patient   Risks discussed:  Bleeding, incomplete drainage, infection and pain   Alternatives discussed:  Referral Universal protocol:    Procedure explained and questions answered to patient or proxy's satisfaction: yes     Patient identity confirmed:  Verbally with patient Location:    Type:  Pilonidal cyst   Size:  4 cm x 2 cm   Location:  Anogenital   Anogenital location:  Pilonidal Pre-procedure details:    Skin preparation:  Chlorhexidine with alcohol Sedation:    Sedation type:  None Anesthesia:    Anesthesia method:  Local infiltration   Local anesthetic:  Lidocaine 1% WITH epi Procedure type:    Complexity:  Complex Procedure details:    Ultrasound guidance: no     Needle aspiration: no     Incision types:  Single straight   Incision depth:  Dermal   Wound management:  Probed and deloculated and irrigated with saline   Drainage:  Bloody and purulent   Drainage amount:  Moderate   Wound treatment:  Wound left open   Packing materials:  None Post-procedure details:    Procedure completion:  Tolerated  (including critical care time)  Medications Ordered in UC Medications - No data to display  Initial Impression / Assessment and Plan / UC Course  I have reviewed the triage vital signs and the nursing notes.  Pertinent labs & imaging results that were available during my care of the patient were reviewed by me and considered in my medical decision making (see chart for details).     I&D was performed in clinic with  improvement of symptoms.  Discussed patient utility of packing but patient reports that this has been problematic for him in the past and he preferred not to do this.  Recommended that he use warm compresses several times per day to encourage  drainage.  He is to keep area clean and apply Bactroban ointment with dressing changes.  We will start Bactrim DS.  He was instructed to stop the medication to be seen immediately if he develops any rash or lesions.  He can use over-the-counter medications for pain relief.  Discussed that he will likely have recurrent symptoms until underlying cyst is removed and recommended that he follow-up with the surgeon.  He was given contact information for local provider with instruction to call to schedule an appointment.  Discussed that if he has any worsening or changing symptoms he needs to go to the emergency room immediately to which he expressed understanding.  Final Clinical Impressions(s) / UC Diagnoses   Final diagnoses:  Pilonidal abscess     Discharge Instructions      We did not pack this area.  It is important that you use warm compresses and keep the area clean to prevent it from closing up and reaccumulating.  Use Bactroban ointment with dressing changes 2-3 times per day.  Started Bactrim DS.  If you develop any rash or lesions stop the medication to be seen immediately.  Use Tylenol and ibuprofen for pain relief.  Once this is healed I do recommend you follow-up with the surgeon as he will likely need to have this removed in order to prevent it from occurring.  Call them to schedule an appointment.  If anything worsens and you have fever, nausea, vomiting, increased pain you need to go to the emergency room immediately.     ED Prescriptions     Medication Sig Dispense Auth. Provider   sulfamethoxazole-trimethoprim (BACTRIM DS) 800-160 MG tablet Take 1 tablet by mouth 2 (two) times daily for 7 days. 14 tablet Talani Brazee K, PA-C   mupirocin  ointment (BACTROBAN) 2 % Apply 1 Application topically 2 (two) times daily. 22 g Marquese Burkland K, PA-C      PDMP not reviewed this encounter.   Jeani Hawking, PA-C 06/02/22 1216    Fatin Bachicha, Noberto Retort, PA-C 06/02/22 1217

## 2022-09-15 ENCOUNTER — Emergency Department (HOSPITAL_COMMUNITY)
Admission: EM | Admit: 2022-09-15 | Discharge: 2022-09-16 | Disposition: A | Discharge status: Home / Self Care | Payer: Self-pay | Attending: Emergency Medicine | Admitting: Emergency Medicine

## 2022-09-15 ENCOUNTER — Other Ambulatory Visit: Payer: Self-pay

## 2022-09-15 DIAGNOSIS — L0501 Pilonidal cyst with abscess: Secondary | ICD-10-CM | POA: Insufficient documentation

## 2022-09-15 NOTE — ED Triage Notes (Signed)
Patient reports cyst / abscess at right buttocks onset this week .

## 2022-09-16 MED ORDER — DOXYCYCLINE HYCLATE 100 MG PO CAPS: 100.0000 mg | ORAL_CAPSULE | Freq: Two times a day (BID) | ORAL | 0 refills | Status: AC

## 2022-09-16 MED ORDER — DOXYCYCLINE HYCLATE 100 MG PO TABS
100.0000 mg | ORAL_TABLET | Freq: Once | ORAL | Status: AC
  Administered 2022-09-16: 100 mg via ORAL
  Filled 2022-09-16: qty 1

## 2022-09-16 MED ORDER — LIDOCAINE HCL (PF) 1 % IJ SOLN
10.0000 mL | Freq: Once | INTRAMUSCULAR | Status: AC
  Administered 2022-09-16: 10 mL
  Filled 2022-09-16: qty 10

## 2022-09-16 NOTE — ED Notes (Signed)
Provider at the bedside, I/d tray and lidocaine set up.

## 2022-09-16 NOTE — ED Provider Notes (Signed)
[facility] EMERGENCY DEPARTMENT Provider Note   CSN: [id number] Arrival date & time: 09/15/22  2241     History  Chief Complaint  Patient presents with   Abscess R-Buttocks    Dale Evans is a 32 y.o. male.  HPI  Patient is a 32 year old male with past medical history significant for abdominal abscesses presented emergency room today with chief complaint of pilonidal abscess  He is present emergency room today with complaints of abscess between his buttocks slightly to the right.  He states that it began swelling approximately 1 week ago.  He states it is progressively worsened.  Denies any other complaints.  No rectal pain or pain with defecation.  No trauma to this area.  No fevers chills lightheadedness dizziness cough congestion.       Home Medications Prior to Admission medications   Medication Sig Start Date End Date Taking? Authorizing Provider  doxycycline (VIBRAMYCIN) 100 MG capsule Take 1 capsule (100 mg total) by mouth 2 (two) times daily for 7 days. 09/16/22 09/23/22 Yes Darleen Moffitt S, PA  diphenhydrAMINE (BENADRYL) 25 MG tablet Take 1 tablet (25 mg total) by mouth every 6 (six) hours as needed. Patient not taking: Reported on 12/28/2021 01/07/20   Arby Barrette, MD  mupirocin ointment (BACTROBAN) 2 % Apply 1 Application topically 2 (two) times daily. 06/02/22   Raspet, Noberto Retort, PA-C      Allergies    Licorice flavor [artificial licorice flavor], Amoxicillin, and Amoxicillin    Review of Systems   Review of Systems  Physical Exam Updated Vital Signs BP (!) 159/99   Pulse 79   Temp 98.7 F (37.1 C)   Resp 18   SpO2 94%  Physical Exam Vitals and nursing note reviewed.  Constitutional:      General: He is not in acute distress.    Appearance: Normal appearance. He is not ill-appearing.  HENT:     Head: Normocephalic and atraumatic.  Eyes:     General: No scleral icterus.       Right eye: No discharge.        Left eye: No  discharge.     Conjunctiva/sclera: Conjunctivae normal.  Pulmonary:     Effort: Pulmonary effort is normal.     Breath sounds: No stridor.  Skin:    Comments: Large approximately 3.5 x 3 cm pilonidal abscess located at the top right of the gluteal cleft --completely remote from patient's anus  Neurological:     Mental Status: He is alert and oriented to person, place, and time. Mental status is at baseline.     ED Results / Procedures / Treatments   Labs (all labs ordered are listed, but only abnormal results are displayed) Labs Reviewed - No data to display  EKG None  Radiology No results found.  Procedures .Marland KitchenIncision and Drainage  Date/Time: 09/16/2022 6:47 AM  Performed by: Gailen Shelter, PA Authorized by: Gailen Shelter, PA   Consent:    Consent obtained:  Verbal   Consent given by:  Patient   Risks discussed:  Bleeding, incomplete drainage, pain and damage to other organs   Alternatives discussed:  No treatment Universal protocol:    Procedure explained and questions answered to patient or proxy's satisfaction: yes     Relevant documents present and verified: yes     Test results available : yes     Imaging studies available: yes     Required blood products, implants, devices, and special  equipment available: yes     Site/side marked: yes     Immediately prior to procedure, a time out was called: yes     Patient identity confirmed:  Verbally with patient Location:    Type:  Pilonidal cyst (Pilonidal abscess)   Size:  4x3   Location:  Anogenital   Anogenital location:  Pilonidal Pre-procedure details:    Skin preparation:  Betadine Anesthesia:    Anesthesia method:  Local infiltration   Local anesthetic:  Lidocaine 1% w/o epi Procedure type:    Complexity:  Complex Procedure details:    Incision types:  Single straight   Incision depth:  Subcutaneous   Wound management:  Probed and deloculated, irrigated with saline and extensive cleaning   Drainage:   Purulent   Drainage amount:  Moderate   Packing materials:  1/4 in gauze Post-procedure details:    Procedure completion:  Tolerated well, no immediate complications     Medications Ordered in ED Medications  lidocaine (PF) (XYLOCAINE) 1 % injection 10 mL (has no administration in time range)  doxycycline (VIBRA-TABS) tablet 100 mg (has no administration in time range)    ED Course/ Medical Decision Making/ A&P                           Medical Decision Making Risk Prescription drug management.   Patient is a 32 year old male with past medical history significant for abdominal abscesses presented emergency room today with chief complaint of pilonidal abscess  He is present emergency room today with complaints of abscess between his buttocks slightly to the right.  He states that it began swelling approximately 1 week ago.  He states it is progressively worsened.  Denies any other complaints.  No rectal pain or pain with defecation.  No trauma to this area.  No fevers chills lightheadedness dizziness cough congestion.  Patient had pilonidal abscess on exam  Incision and drainage conducted.  Patient tolerated procedure well.  Doxycycline administered here in the ER.  Will discharge home with the same.  Return precautions discussed.  Will need follow-up in clinic or return to emergency room for wound check in 48 hours. Tylenol Motrin for pain.  Will give general surgery follow-up in the hopes that he will contact them and follow-up in their office.   Final Clinical Impression(s) / ED Diagnoses Final diagnoses:  Pilonidal abscess    Rx / DC Orders ED Discharge Orders          Ordered    doxycycline (VIBRAMYCIN) 100 MG capsule  2 times daily        09/16/22 [place], Tanyia Grabbe S, PA 09/17/22 0420    Maudie Flakes, MD 09/17/22 [phone]

## 2022-09-16 NOTE — Discharge Instructions (Addendum)
Take antibiotics as prescribed for the entire course.  Follow-up with general surgery  Please use Tylenol or ibuprofen for pain.  You may use 600 mg ibuprofen every 6 hours or 1000 mg of Tylenol every 6 hours.  You may choose to alternate between the 2.  This would be most effective.  Not to exceed 4 g of Tylenol within 24 hours.  Not to exceed 3200 mg ibuprofen 24 hours.   Please take all of your antibiotics until finished!   You may develop abdominal discomfort or diarrhea from the antibiotic.  You may help offset this with probiotics which you can buy or get in yogurt. Do not eat  or take the probiotics until 2 hours after your antibiotic.

## 2022-09-18 ENCOUNTER — Other Ambulatory Visit: Payer: Self-pay

## 2022-09-18 ENCOUNTER — Ambulatory Visit (HOSPITAL_COMMUNITY)
Admission: EM | Admit: 2022-09-18 | Discharge: 2022-09-18 | Disposition: A | Discharge status: Home / Self Care | Payer: Self-pay

## 2022-09-18 ENCOUNTER — Encounter (HOSPITAL_COMMUNITY): Payer: Self-pay | Admitting: Emergency Medicine

## 2022-09-18 DIAGNOSIS — L0591 Pilonidal cyst without abscess: Secondary | ICD-10-CM

## 2022-09-18 DIAGNOSIS — Z5189 Encounter for other specified aftercare: Secondary | ICD-10-CM

## 2022-09-18 NOTE — ED Provider Notes (Signed)
[place]    CSN: [id number] Arrival date & time: 09/18/22  0907      History   Chief Complaint Chief Complaint  Patient presents with   Wound Check    HPI Dale Evans is a 32 y.o. male.  Patient presents for follow-up on pilonidal cyst.  Patient was seen in the emergency department on 09/15/2022, incision and drainage was performed and packing was placed.  Patient reports some bloody drainage from the site.  Patient denies any increased pain or purulent drainage.  Patient denies any fevers, chills, or any systemic symptoms.  Patient reports the pilonidal cyst has been chronic, he states that this is the fourth time that this has occurred.  He request information for surgical follow-up. He is currently taking doxycycline after that incision and drainage.   Wound Check    Past Medical History:  Diagnosis Date   Vertigo     There are no problems to display for this patient.   Past Surgical History:  Procedure Laterality Date   KNEE SURGERY     TONSILLECTOMY         Home Medications    Prior to Admission medications   Medication Sig Start Date End Date Taking? Authorizing Provider  diphenhydrAMINE (BENADRYL) 25 MG tablet Take 1 tablet (25 mg total) by mouth every 6 (six) hours as needed. Patient not taking: Reported on 12/28/2021 01/07/20   Charlesetta Shanks, MD  doxycycline (VIBRAMYCIN) 100 MG capsule Take 1 capsule (100 mg total) by mouth 2 (two) times daily for 7 days. 09/16/22 09/23/22  Tedd Sias, PA  mupirocin ointment (BACTROBAN) 2 % Apply 1 Application topically 2 (two) times daily. 06/02/22   Raspet, Derry Skill, PA-C    Family History Family History  Problem Relation Age of Onset   Healthy Mother    Cancer Mother    Hypertension Mother    Healthy Father    Diabetes Father    Hypertension Maternal Grandmother    Stroke Maternal Grandmother     Social History Social History   Tobacco Use   Smoking status: Never   Smokeless tobacco: Never   Substance Use Topics   Alcohol use: No   Drug use: Not Currently    Comment: former user of CBD last used 12/11/19     Allergies   Licorice flavor [artificial licorice flavor], Amoxicillin, and Amoxicillin   Review of Systems Review of Systems Per HPI  Physical Exam Triage Vital Signs ED Triage Vitals  Enc Vitals Group     BP 09/18/22 1018 (!) 171/115     Pulse Rate 09/18/22 1018 77     Resp 09/18/22 1018 18     Temp 09/18/22 1018 98.3 F (36.8 C)     Temp src --      SpO2 09/18/22 1018 98 %     Weight --      Height --      Head Circumference --      Peak Flow --      Pain Score 09/18/22 1016 4     Pain Loc --      Pain Edu? --      Excl. in [place]? --    No data found.  Updated Vital Signs BP (!) 171/115   Pulse 77   Temp 98.3 F (36.8 C)   Resp 18   SpO2 98%      Physical Exam Vitals and nursing note reviewed.  Constitutional:  Appearance: Normal appearance.  Skin:    Findings: Wound present.          Comments: Approximately 3 cm in diameter open wound present in gluteal cleft. Serosanguinous drainage present at sight.  Packing removed from site.  No tenderness upon palpation. No erythema present.    Neurological:     Mental Status: He is alert.      UC Treatments / Results  Labs (all labs ordered are listed, but only abnormal results are displayed) Labs Reviewed - No data to display  EKG   Radiology No results found.  Procedures Procedures (including critical care time)  Medications Ordered in UC Medications - No data to display  Initial Impression / Assessment and Plan / UC Course  I have reviewed the triage vital signs and the nursing notes.  Pertinent labs & imaging results that were available during my care of the patient were reviewed by me and considered in my medical decision making (see chart for details).    Patient was evaluated for wound check and pilonidal cyst.  Physical exam was reassuring, appears that the area  is healing with no active signs of infection, packing was removed from site.  Patient was made aware to continue with doxycycline antibiotic.  He was given information for surgical follow-up.  Patient was educated on high blood pressure and was made aware that he should begin taking blood pressure at home, he was given information to establish care with a PCP.  Patient and patient's spouse verbalized understanding of instructions.   Charting was provided using a a verbal dictation system, charting was proofread for errors, errors may occur which could change the meaning of the information charted.   Final Clinical Impressions(s) / UC Diagnoses   Final diagnoses:  Visit for wound check  Pilonidal cyst     Discharge Instructions      Please continue taking the doxycycline antibiotic.  I have attached information for surgical follow-up.      ED Prescriptions   None    PDMP not reviewed this encounter.   Debby Freiberg, NP 09/18/22 1143

## 2022-09-18 NOTE — Discharge Instructions (Addendum)
Please continue taking the doxycycline antibiotic.  I have attached information for surgical follow-up.

## 2022-09-18 NOTE — ED Triage Notes (Signed)
PT seen on 09-15-22 for abscess and returns today for recheck.

## 2022-11-07 ENCOUNTER — Encounter (HOSPITAL_COMMUNITY): Payer: Self-pay

## 2022-11-07 ENCOUNTER — Ambulatory Visit (HOSPITAL_COMMUNITY)
Admission: EM | Admit: 2022-11-07 | Discharge: 2022-11-07 | Disposition: A | Discharge status: Home / Self Care | Payer: Self-pay | Attending: Internal Medicine | Admitting: Internal Medicine

## 2022-11-07 DIAGNOSIS — J111 Influenza due to unidentified influenza virus with other respiratory manifestations: Secondary | ICD-10-CM

## 2022-11-07 DIAGNOSIS — I10 Essential (primary) hypertension: Secondary | ICD-10-CM

## 2022-11-07 HISTORY — DX: Bronchitis, not specified as acute or chronic: J40

## 2022-11-07 MED ORDER — BENZONATATE 100 MG PO CAPS: 100.0000 mg | ORAL_CAPSULE | Freq: Three times a day (TID) | ORAL | 0 refills | Status: DC

## 2022-11-07 MED ORDER — ONDANSETRON 4 MG PO TBDP: 4.0000 mg | ORAL_TABLET | Freq: Three times a day (TID) | ORAL | 0 refills | Status: DC | PRN

## 2022-11-07 MED ORDER — ONDANSETRON 4 MG PO TBDP
ORAL_TABLET | ORAL | Status: AC
  Filled 2022-11-07: qty 1

## 2022-11-07 MED ORDER — IBUPROFEN 800 MG PO TABS
800.0000 mg | ORAL_TABLET | Freq: Once | ORAL | Status: AC
  Administered 2022-11-07: 800 mg via ORAL

## 2022-11-07 MED ORDER — ONDANSETRON 4 MG PO TBDP
4.0000 mg | ORAL_TABLET | Freq: Once | ORAL | Status: AC
  Administered 2022-11-07: 4 mg via ORAL

## 2022-11-07 MED ORDER — OSELTAMIVIR PHOSPHATE 75 MG PO CAPS: 75.0000 mg | ORAL_CAPSULE | Freq: Two times a day (BID) | ORAL | 0 refills | Status: DC

## 2022-11-07 MED ORDER — IBUPROFEN 800 MG PO TABS
ORAL_TABLET | ORAL | Status: AC
  Filled 2022-11-07: qty 1

## 2022-11-07 MED ORDER — AMLODIPINE BESYLATE 5 MG PO TABS: 5.0000 mg | ORAL_TABLET | Freq: Every day | ORAL | 2 refills | Status: DC

## 2022-11-07 NOTE — Discharge Instructions (Addendum)
Your symptoms are most likely due to a viral illness, which will improve on its own with rest and fluids.  We will treat your symptoms with the following: - Take tamiflu twice daily for the next 5 days - Zofran '4mg'$  every 8 hours as needed to help with nausea and vomiting.  Eat a bland diet for the next 12-24 hours (bananas, rice, white toast, and applesauce) once you are able to tolerate liquids (broth, etc). These foods are easy for your stomach to digest. Pedialyte can be purchased to help with rehydration. Drink plenty of water.  - Ibuprofen '600mg'$  and/or tylenol 1,'000mg'$  every 6 hours as needed for fever/chills and aches/pains. - Guaifenesin (Mucinex) may be purchased over the counter to be taken as directed to reduce nasal congestion/cough - Tessalon perles every 8 hours as needed for coughing  - Two teaspoons of honey (10 ml) can be given by mouth or in warm water every four hours as needed and may decrease the amount of coughing. - Use a cool mist humidifier in your room at night  Your blood pressure was very elevated in the clinic today.   Start taking amlodipine blood pressure medicine 5 mg once daily to help gradually lower your blood pressure. Take your blood pressure once daily for the next couple of weeks while your blood pressure medicine kicks in and write these numbers down in a notebook.  After 2 weeks, you may take your blood pressure 3-4 times a week.  Bring the notebook to your primary care provider visit so that they are able to see what your blood pressures have been at home and make medical decisions based off of this data.   Lower the amount of salt in your diet to less than 1 gram of salt per day and increase exercise to naturally lower BP. Review information provided regarding high blood pressure.  Schedule an appointment with primary care provider for ongoing management of high blood pressure and for routine healthcare screenings.  Please go to the ER if you develop any  severe symptoms such as chest pain, sudden shortness of breath, or one-sided weakness. I hope you feel better!

## 2022-11-07 NOTE — ED Provider Notes (Signed)
[place]    CSN: [id number] Arrival date & time: 11/07/22  1638      History   Chief Complaint Chief Complaint  Patient presents with  . Nasal Congestion  . Sore Throat  . Fever  . Chills  . Generalized Body Aches  . Headache  . Cough    HPI Dale Evans is a 32 y.o. male.   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  Fever Associated symptoms: cough and headaches   Headache Associated symptoms: cough and fever   Cough Associated symptoms: fever and headaches     Past Medical History:  Diagnosis Date  . Bronchitis   . Vertigo     There are no problems to display for this patient.   Past Surgical History:  Procedure Laterality Date  . KNEE SURGERY    . TONSILLECTOMY         Home Medications    Prior to Admission medications   Medication Sig Start Date End Date Taking? Authorizing Provider  amLODipine (NORVASC) 5 MG tablet Take 1 tablet (5 mg total) by mouth daily. 11/07/22  Yes Talbot Grumbling, FNP  benzonatate (TESSALON) 100 MG capsule Take 1 capsule (100 mg total) by mouth every 8 (eight) hours. 11/07/22  Yes Talbot Grumbling, FNP  ondansetron (ZOFRAN-ODT) 4 MG disintegrating tablet Take 1 tablet (4 mg total) by mouth every 8 (eight) hours as needed for nausea or vomiting. 11/07/22  Yes Talbot Grumbling, FNP  oseltamivir (TAMIFLU) 75 MG capsule Take 1 capsule (75 mg total) by mouth every 12 (twelve) hours. 11/07/22  Yes Talbot Grumbling, FNP  diphenhydrAMINE (BENADRYL) 25 MG tablet Take 1 tablet (25 mg total) by mouth every 6 (six) hours as needed. Patient not taking: Reported on 12/28/2021 01/07/20   Charlesetta Shanks, MD  mupirocin ointment (BACTROBAN) 2 % Apply 1 Application topically 2 (two) times daily. 06/02/22    Raspet, Derry Skill, PA-C    Family History Family History  Problem Relation Age of Onset  . Healthy Mother   . Cancer Mother   . Hypertension Mother   . Healthy Father   . Diabetes Father   . Hypertension Maternal Grandmother   . Stroke Maternal Grandmother     Social History Social History   Tobacco Use  . Smoking status: Never  . Smokeless tobacco: Never  Vaping Use  . Vaping Use: Never used  Substance Use Topics  . Alcohol use: No  . Drug use: Not Currently    Comment: former user of CBD last used 12/11/19     Allergies   Licorice flavor [artificial licorice flavor], Amoxicillin, and Amoxicillin   Review of Systems Review of Systems  Constitutional:  Positive for fever.  Respiratory:  Positive for cough.   Neurological:  Positive for headaches.     Physical Exam Triage Vital Signs ED Triage Vitals  Enc Vitals Group     BP 11/07/22 1709 (!) 165/112     Pulse Rate 11/07/22 1709 77     Resp 11/07/22 1709 16     Temp 11/07/22 1709 98.2 F (36.8 C)     Temp Source 11/07/22 1709 Oral     SpO2 11/07/22 1709 96 %     Weight --      Height --  Head Circumference --      Peak Flow --      Pain Score 11/07/22 1710 10     Pain Loc --      Pain Edu? --      Excl. in [place]? --    No data found.  Updated Vital Signs BP (!) 165/112 (BP Location: Left Arm)   Pulse 77   Temp 98.2 F (36.8 C) (Oral)   Resp 16   SpO2 96%   Visual Acuity Right Eye Distance:   Left Eye Distance:   Bilateral Distance:    Right Eye Near:   Left Eye Near:    Bilateral Near:     Physical Exam   UC Treatments / Results  Labs (all labs ordered are listed, but only abnormal results are displayed) Labs Reviewed - No data to display  EKG   Radiology No results found.  Procedures Procedures (including critical care time)  Medications Ordered in UC Medications  ondansetron (ZOFRAN-ODT) disintegrating tablet 4 mg (has no administration in time range)  ibuprofen (ADVIL)  tablet 800 mg (has no administration in time range)    Initial Impression / Assessment and Plan / UC Course  I have reviewed the triage vital signs and the nursing notes.  Pertinent labs & imaging results that were available during my care of the patient were reviewed by me and considered in my medical decision making (see chart for details).     *** Final Clinical Impressions(s) / UC Diagnoses   Final diagnoses:  Influenza-like illness  Essential hypertension   Discharge Instructions   None    ED Prescriptions     Medication Sig Dispense Auth. Provider   amLODipine (NORVASC) 5 MG tablet Take 1 tablet (5 mg total) by mouth daily. 30 tablet Talbot Grumbling, FNP   oseltamivir (TAMIFLU) 75 MG capsule Take 1 capsule (75 mg total) by mouth every 12 (twelve) hours. 10 capsule Talbot Grumbling, FNP   ondansetron (ZOFRAN-ODT) 4 MG disintegrating tablet Take 1 tablet (4 mg total) by mouth every 8 (eight) hours as needed for nausea or vomiting. 20 tablet Joella Prince M, FNP   benzonatate (TESSALON) 100 MG capsule Take 1 capsule (100 mg total) by mouth every 8 (eight) hours. 21 capsule Talbot Grumbling, FNP      PDMP not reviewed this encounter.

## 2022-11-07 NOTE — ED Triage Notes (Signed)
Patient c/o sore throat,body aches, headache, cough, fever and chills since yesterday. Patient states he was around a friend wo tested positive flu.  Patient states he took Mucinex multi symptom last night and tylenol 650 mg  at 1500 today.

## 2022-11-13 ENCOUNTER — Ambulatory Visit: Payer: Self-pay | Admitting: Surgery

## 2022-11-13 NOTE — H&P (Signed)
Subjective    Chief Complaint: New Consultation (Pilonidal Cyst)       History of Present Illness: Lakoda Tondre is a 32 y.o. male who is seen today as an office consultation at the request of Urgent Care for evaluation of New Consultation (Pilonidal Cyst) .     This is a healthy 32 year old male who presents with a 3-year history of intermittent pilonidal abscess with swelling and drainage.  He has had 5 previous incision and drainage.  None of these were by a surgeon.  The most recent was in January of this year.  Since that time the opening has healed but he has developed swelling.  Last week it spontaneously burst and drained some bloody purulent fluid.  The drainage has stopped.  He thinks that this area is beginning to swell some more.  Currently no tenderness.  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     Review of Systems: A complete review of systems was obtained from the patient.  I have reviewed this information and discussed as appropriate with the patient.  See HPI as well for other ROS.   Review of Systems  Constitutional: Negative.   HENT: Negative.    Eyes: Negative.   Respiratory: Negative.    Cardiovascular: Negative.   Gastrointestinal: Negative.   Genitourinary: Negative.   Musculoskeletal: Negative.   Skin: Negative.   Neurological: Negative.   Endo/Heme/Allergies: Negative.   Psychiatric/Behavioral: Negative.          Medical History: Past Medical History      Past Medical History:  Diagnosis Date   Hypertension             Patient Active Problem List  Diagnosis   Essential hypertension   Pilonidal abscess      Past Surgical History       Past Surgical History:  Procedure Laterality Date   tonsilectomy   2018        Allergies      Allergies  Allergen Reactions   Amoxicillin Nausea        No current outpatient medications on file prior to visit.     No current facility-administered medications on file prior to visit.      Family History       Family History  Problem Relation Age of Onset   High blood pressure (Hypertension) Mother     Breast cancer Mother          Social History        Tobacco Use  Smoking Status Former   Types: Cigarettes  Smokeless Tobacco Never      Social History  Social History         Socioeconomic History   Marital status: Married  Tobacco Use   Smoking status: Former      Types: Cigarettes   Smokeless tobacco: Never  Vaping Use   Vaping Use: Never used  Substance and Sexual Activity   Alcohol use: Never   Drug use: Never        Objective:         Vitals:    11/13/22 0949  BP: 130/80  Pulse: 87  Temp: 36.2 C (97.2 F)  SpO2: 98%  Weight: (!) 160.9 kg (354 lb 12.8 oz)  Height: 190.5 cm ('6\' 3"'$ )  PainSc: 0-No pain    Body mass index is 44.35 kg/m.   Physical Exam  Constitutional:  WDWN in NAD, conversant, no obvious deformities; lying in bed comfortably Eyes:  Pupils equal, round; sclera anicteric; moist conjunctiva; no lid lag HENT:  Oral mucosa moist; good dentition  Neck:  No masses palpated, trachea midline; no thyromegaly Lungs:  CTA bilaterally; normal respiratory effort CV:  Regular rate and rhythm; no murmurs; extremities well-perfused with no edema Abd:  +bowel sounds, soft, non-tender, no palpable organomegaly; no palpable hernias Musc:  normal gait; no apparent clubbing or cyanosis in extremities Lymphatic:  No palpable cervical or axillary lymphadenopathy Skin:  Warm, dry; no sign of jaundice Just over the coccyx, there is a 2.5 x 2.0 cm scar with a firm palpable mass just underneath the skin.  No erythema.  No fluctuance.  No drainage.  Minimal tenderness.  No open skin pits. Psychiatric - alert and oriented x 4; calm mood and affect   Assessment and Plan:  Diagnoses and all orders for this visit:   Pilonidal abscess       Patient has a chronic  pilonidal abscess with intermittent swelling and drainage.  Recommend pilonidal cystectomy for definitive treatment.The surgical procedure has been discussed with the patient.  Potential risks, benefits, alternative treatments, and expected outcomes have been explained.  All of the patient's questions at this time have been answered.  The likelihood of reaching the patient's treatment goal is good.  The patient understand the proposed surgical procedure and wishes to proceed.     We will try to schedule this soon as possible as the patient is scheduled to begin a new role at work and would like to be healed up prior to taking over this new position.   Hurshel Bouillon Jearld Adjutant, MD  11/13/2022 10:16 AM

## 2022-11-17 ENCOUNTER — Encounter (HOSPITAL_COMMUNITY)
Admission: RE | Admit: 2022-11-17 | Discharge: 2022-11-17 | Disposition: A | Discharge status: Home / Self Care | Payer: BLUE CROSS/BLUE SHIELD | Source: Ambulatory Visit | Attending: Surgery | Admitting: Surgery

## 2022-11-17 ENCOUNTER — Encounter (HOSPITAL_COMMUNITY): Payer: Self-pay

## 2022-11-17 ENCOUNTER — Other Ambulatory Visit: Payer: Self-pay

## 2022-11-17 VITALS — BP 173/106 | HR 73 | Temp 98.6°F | Resp 20 | Ht 75.0 in | Wt 353.0 lb

## 2022-11-17 DIAGNOSIS — I10 Essential (primary) hypertension: Secondary | ICD-10-CM | POA: Diagnosis not present

## 2022-11-17 DIAGNOSIS — B9689 Other specified bacterial agents as the cause of diseases classified elsewhere: Secondary | ICD-10-CM | POA: Diagnosis not present

## 2022-11-17 DIAGNOSIS — L0501 Pilonidal cyst with abscess: Secondary | ICD-10-CM | POA: Insufficient documentation

## 2022-11-17 DIAGNOSIS — Z01818 Encounter for other preprocedural examination: Secondary | ICD-10-CM | POA: Insufficient documentation

## 2022-11-17 HISTORY — DX: Sleep apnea, unspecified: G47.30

## 2022-11-17 HISTORY — DX: Essential (primary) hypertension: I10

## 2022-11-17 LAB — BASIC METABOLIC PANEL
Anion gap: 8 (ref 5–15)
BUN: 17 mg/dL (ref 6–20)
CO2: 26 mmol/L (ref 22–32)
Calcium: 8.9 mg/dL (ref 8.9–10.3)
Chloride: 105 mmol/L (ref 98–111)
Creatinine, Ser: 1.16 mg/dL (ref 0.61–1.24)
GFR, Estimated: >60 mL/min (ref >60)
Glucose, Bld: 115 mg/dL — ABNORMAL HIGH (ref 70–99)
Potassium: 3.8 mmol/L (ref 3.5–5.1)
Sodium: 139 mmol/L (ref 135–145)

## 2022-11-17 NOTE — Patient Instructions (Addendum)
SURGICAL WAITING ROOM VISITATION Patients having surgery or a procedure may have no more than 2 support people in the waiting area - these visitors may rotate.    If the patient needs to stay at the hospital during part of their recovery, the visitor guidelines for inpatient rooms apply. Pre-op nurse will coordinate an appropriate time for 1 support person to accompany patient in pre-op.  This support person may not rotate.    Please refer to the [facility] website for the visitor guidelines for Inpatients (after your surgery is over and you are in a regular room).   Due to an increase in RSV and influenza rates and associated hospitalizations, children ages 5 and under may not visit patients in [place].     Your procedure is scheduled on:  11-23-22   Report to [facility] Main Entrance    Report to admitting at 10:00 AM   Call this number if you have problems the morning of surgery [phone]   Do not eat food :After Midnight.   After Midnight you may have the following liquids until 9:15 AM/ DAY OF SURGERY  Water Non-Citrus Juices (without pulp, NO RED) Carbonated Beverages Black Coffee (NO MILK/CREAM OR CREAMERS, sugar ok)  Clear Tea (NO MILK/CREAM OR CREAMERS, sugar ok) regular and decaf                             Plain Jell-O (NO RED)                                           Fruit ices (not with fruit pulp, NO RED)                                     Popsicles (NO RED)                                                               Sports drinks like Gatorade (NO RED)                       If you have questions, please contact your surgeon's office.   FOLLOW ANY ADDITIONAL PRE OP INSTRUCTIONS YOU RECEIVED FROM YOUR SURGEON'S OFFICE!!!     Oral Hygiene is also important to reduce your risk of infection.                                    Remember - BRUSH YOUR TEETH THE MORNING OF SURGERY WITH YOUR REGULAR TOOTHPASTE   Do NOT smoke after  Midnight   Take these medicines the morning of surgery with A SIP OF WATER:   Amlodipine                              You may not have any metal on your body including jewelry, and body piercing  Do not wear lotions, powders, cologne, or deodorant              Men may shave face and neck.   Do not bring valuables to the hospital. [place].   Contacts, dentures or bridgework may not be worn into surgery.    DO NOT [place]. PHARMACY WILL DISPENSE MEDICATIONS LISTED ON YOUR MEDICATION LIST TO YOU DURING YOUR ADMISSION [place]!    Patients discharged on the day of surgery will not be allowed to drive home.  Someone NEEDS to stay with you for the first 24 hours after anesthesia.              Please read over the following fact sheets you were given: IF Dale Evans  If you received a COVID test during your pre-op visit  it is requested that you wear a mask when out in public, stay away from anyone that may not be feeling well and notify your surgeon if you develop symptoms. If you test positive for Covid or have been in contact with anyone that has tested positive in the last 10 days please notify you surgeon.  [facility] - Preparing for Surgery Before surgery, you can play an important role.  Because skin is not sterile, your skin needs to be as free of germs as possible.  You can reduce the number of germs on your skin by washing with CHG (chlorahexidine gluconate) soap before surgery.  CHG is an antiseptic cleaner which kills germs and bonds with the skin to continue killing germs even after washing. Please DO NOT use if you have an allergy to CHG or antibacterial soaps.  If your skin becomes reddened/irritated stop using the CHG and inform your nurse when you arrive at Short Stay. Do not shave (including legs and underarms) for at  least 48 hours prior to the first CHG shower.  You may shave your face/neck.  Please follow these instructions carefully:  1.  Shower with CHG Soap the night before surgery and the  morning of surgery.  2.  If you choose to wash your hair, wash your hair first as usual with your normal  shampoo.  3.  After you shampoo, rinse your hair and body thoroughly to remove the shampoo.                             4.  Use CHG as you would any other liquid soap.  You can apply chg directly to the skin and wash.  Gently with a scrungie or clean washcloth.  5.  Apply the CHG Soap to your body ONLY FROM THE NECK DOWN.   Do   not use on face/ open                           Wound or open sores. Avoid contact with eyes, ears mouth and   genitals (private parts).                       Wash face,  Genitals (private parts) with your normal soap.             6.  Wash thoroughly, paying special attention to the area where your    surgery  will be  performed.  7.  Thoroughly rinse your body with warm water from the neck down.  8.  DO NOT shower/wash with your normal soap after using and rinsing off the CHG Soap.                9.  Pat yourself dry with a clean towel.            10.  Wear clean pajamas.            11.  Place clean sheets on your bed the night of your first shower and do not  sleep with pets. Day of Surgery : Do not apply any lotions/deodorants the morning of surgery.  Please wear clean clothes to the hospital/surgery center.  FAILURE TO FOLLOW THESE INSTRUCTIONS MAY RESULT IN THE CANCELLATION OF YOUR SURGERY  PATIENT SIGNATURE_________________________________  NURSE SIGNATURE__________________________________  ________________________________________________________________________

## 2022-11-17 NOTE — Progress Notes (Addendum)
COVID Vaccine Completed:  No  Date of COVID positive in last 90 days:  No  PCP - no PCP Cardiologist - N/A  Chest x-ray - N/A EKG - 11-17-22 Epic Stress Test - N/A ECHO - N/A Cardiac Cath - N/A Pacemaker/ICD device last checked: Spinal Cord Stimulator:N/A  Bowel Prep - N/A  Sleep Study - Yes, +sleep apnea CPAP - No  Fasting Blood Sugar - N/A Checks Blood Sugar _____ times a day  Last dose of GLP1 agonist-  N/A GLP1 instructions:  N/A   Last dose of SGLT-2 inhibitors-  N/A SGLT-2 instructions: N/A  Blood Thinner Instructions:N/A Aspirin Instructions: Last Dose:  Activity level:  Can go up a flight of stairs and perform activities of daily living without stopping and without symptoms of chest pain or shortness of breath.  Able to exercise without symptoms  Anesthesia review:  Seen in ER for influenza type illness 11-07-22 and treated with Tamiflu.  All symptoms resolved per patient.   BP elevated at PAT, Amlodipine prescribed but patient lost bottle.  He will check with pharmacy to see if he can get a new RX.  Patient able to monitor at home.  Patient phoned back and stated the pharmacy was able to refill Amlodipine and he will start today.  Patient denies shortness of breath, fever, cough and chest pain at PAT appointment  Patient verbalized understanding of instructions that were given to them at the PAT appointment. Patient was also instructed that they will need to review over the PAT instructions again at home before surgery.

## 2022-11-20 NOTE — Progress Notes (Signed)
Anesthesia Chart Review   Case: [id number] Date/Time: 11/23/22 1200   Procedure: PILONIDAL CYSTECTOMY   Anesthesia type: General   Pre-op diagnosis: CHRONIC PILONIDAL ABSCESS   Location: [facility] ROOM 01 / [facility]   Surgeons: Donnie Mesa, MD       DISCUSSION:32 y.o. former smoker with h/o HTN, sleep apnea, chronic pilonidal abscess scheduled for above procedure 11/23/2022 with Dr. Donnie Mesa.   Elevated BP at PAT visit.  Pt reports he was prescribed amlodipine, but lost the prescription.  He called after his PAT visit and reports he got a refill of meds and restarted.  Evaluate DOS.   Pt seen in UC 11/07/2022, treated for Flu with Tamiflu.  Sx onset 11/06/2022. Discussed with [facility] triage nurse, case will need to be postponed 4 weeks from URI with at least 2 weeks of sx resolution.   VS: BP (!) 173/106   Pulse 73   Temp 37 C (Oral)   Resp 20   Ht '6\' 3"'$  (1.905 m)   Wt (!) 160.1 kg   SpO2 98%   BMI 44.12 kg/m   PROVIDERS: Pcp, No   LABS: Labs reviewed: Acceptable for surgery. (all labs ordered are listed, but only abnormal results are displayed)  Labs Reviewed  BASIC METABOLIC PANEL - Abnormal; Notable for the following components:      Result Value   Glucose, Bld 115 (*)    All other components within normal limits     IMAGES:   EKG:   CV:  Past Medical History:  Diagnosis Date   Bronchitis    Hypertension    Sleep apnea    Vertigo     Past Surgical History:  Procedure Laterality Date   ABCESS DRAINAGE     Pilonidal cyst x5   KNEE SURGERY     TONSILLECTOMY      MEDICATIONS:  amLODipine (NORVASC) 5 MG tablet   benzonatate (TESSALON) 100 MG capsule   mupirocin ointment (BACTROBAN) 2 %   Omega-3 Fatty Acids (FISH OIL PO)   ondansetron (ZOFRAN-ODT) 4 MG disintegrating tablet   oseltamivir (TAMIFLU) 75 MG capsule   OVER THE COUNTER MEDICATION   No current facility-administered medications for this encounter.    Dale [name], PA-C [facility]  Pre-Surgical Testing [phone]

## 2022-11-23 DIAGNOSIS — I10 Essential (primary) hypertension: Secondary | ICD-10-CM

## 2022-12-02 ENCOUNTER — Other Ambulatory Visit: Payer: Self-pay

## 2022-12-02 ENCOUNTER — Emergency Department (HOSPITAL_COMMUNITY)
Admission: EM | Admit: 2022-12-02 | Discharge: 2022-12-03 | Disposition: A | Discharge status: Home / Self Care | Payer: BLUE CROSS/BLUE SHIELD | Attending: Emergency Medicine | Admitting: Emergency Medicine

## 2022-12-02 ENCOUNTER — Encounter (HOSPITAL_COMMUNITY): Payer: Self-pay | Admitting: *Deleted

## 2022-12-02 DIAGNOSIS — M545 Low back pain, unspecified: Secondary | ICD-10-CM | POA: Insufficient documentation

## 2022-12-02 NOTE — ED Triage Notes (Signed)
The pt is c/o lower back pain he has had a pipilnidal cyst for 2 years  he is supposed to have surgery in a couple of weeks  he is having much pain

## 2022-12-03 LAB — URINALYSIS, ROUTINE W REFLEX MICROSCOPIC
Bilirubin Urine: NEGATIVE
Glucose, UA: NEGATIVE mg/dL
Hgb urine dipstick: NEGATIVE
Ketones, ur: NEGATIVE mg/dL
Leukocytes,Ua: NEGATIVE
Nitrite: NEGATIVE
Protein, ur: NEGATIVE mg/dL
Specific Gravity, Urine: 1.03 (ref 1.005–1.030)
pH: 5 (ref 5.0–8.0)

## 2022-12-03 MED ORDER — METHOCARBAMOL 500 MG PO TABS: 500.0000 mg | ORAL_TABLET | Freq: Two times a day (BID) | ORAL | 0 refills | Status: DC

## 2022-12-03 MED ORDER — LIDOCAINE 5 % EX PTCH
2.0000 | MEDICATED_PATCH | CUTANEOUS | Status: DC
  Administered 2022-12-03: 2 via TRANSDERMAL
  Filled 2022-12-03: qty 2

## 2022-12-03 MED ORDER — LIDOCAINE 5 % EX PTCH: 1.0000 | MEDICATED_PATCH | CUTANEOUS | 0 refills | Status: DC

## 2022-12-03 NOTE — ED Notes (Signed)
UA sent at 0140

## 2022-12-03 NOTE — ED Notes (Signed)
Pt provided with AVS.  Education complete; all questions answered.  Pt leaving ED in stable condition at this time, ambulatory with all belongings.

## 2022-12-03 NOTE — ED Notes (Signed)
Patient informed that urine needed for UA, states he will attempt now.

## 2022-12-03 NOTE — ED Provider Notes (Signed)
[name] Provider Note   CSN: [id number] Arrival date & time: 12/02/22  2301     History  Chief Complaint  Patient presents with   Back Pain    Dale Evans is a 32 y.o. male.   Back Pain 32 year old male present emergency room today with complaints of low back pain he states he has been having low back pain for the past 3 to 4 days.  He states that it is painful when he bends over and straightens out.  He denies any pain radiating down either leg.  No bowel or bladder incontinence.  No history of IV drug use any nausea vomiting or urinary symptoms.  He states he did have a fever 4 days ago of 1.4 but took 1 dose of Motrin and the fever never returned.  He denies any abdominal pain     Home Medications Prior to Admission medications   Medication Sig Start Date End Date Taking? Authorizing Provider  lidocaine (LIDODERM) 5 % Place 1 patch onto the skin daily. Remove & Discard patch within 12 hours or as directed by MD 12/03/22  Yes Carl Butner, Kathleene Hazel, PA  methocarbamol (ROBAXIN) 500 MG tablet Take 1 tablet (500 mg total) by mouth 2 (two) times daily. 12/03/22  Yes Yalissa Fink S, PA  amLODipine (NORVASC) 5 MG tablet Take 1 tablet (5 mg total) by mouth daily. Patient not taking: Reported on 11/16/2022 11/07/22   Talbot Grumbling, FNP  benzonatate (TESSALON) 100 MG capsule Take 1 capsule (100 mg total) by mouth every 8 (eight) hours. Patient not taking: Reported on 11/16/2022 11/07/22   Talbot Grumbling, FNP  mupirocin ointment (BACTROBAN) 2 % Apply 1 Application topically 2 (two) times daily. Patient not taking: Reported on 11/16/2022 06/02/22   Raspet, Derry Skill, PA-C  Omega-3 Fatty Acids (FISH OIL PO) Take 1 capsule by mouth daily.    [provider]  ondansetron (ZOFRAN-ODT) 4 MG disintegrating tablet Take 1 tablet (4 mg total) by mouth every 8 (eight) hours as needed for nausea or vomiting. Patient not taking: Reported on  11/16/2022 11/07/22   Talbot Grumbling, FNP  oseltamivir (TAMIFLU) 75 MG capsule Take 1 capsule (75 mg total) by mouth every 12 (twelve) hours. Patient not taking: Reported on 11/16/2022 11/07/22   Talbot Grumbling, FNP  OVER THE COUNTER MEDICATION Take 2 capsules by mouth daily. muscletech alpha test    [provider]      Allergies    Licorice flavor [artificial licorice flavor], Amoxicillin, and Bee venom    Review of Systems   Review of Systems  Musculoskeletal:  Positive for back pain.    Physical Exam Updated Vital Signs BP (!) 118/91 (BP Location: Left Arm)   Pulse 69   Temp 98.2 F (36.8 C) (Oral)   Resp 18   Ht 6\' 3"  (1.905 m)   Wt (!) 160.1 kg   SpO2 97%   BMI 44.12 kg/m  Physical Exam Vitals and nursing note reviewed.  Constitutional:      General: He is not in acute distress. HENT:     Head: Normocephalic and atraumatic.     Nose: Nose normal.  Eyes:     General: No scleral icterus. Cardiovascular:     Rate and Rhythm: Normal rate and regular rhythm.     Pulses: Normal pulses.     Heart sounds: Normal heart sounds.  Pulmonary:     Effort: Pulmonary effort is  normal. No respiratory distress.     Breath sounds: No wheezing.  Abdominal:     Palpations: Abdomen is soft.     Tenderness: There is no abdominal tenderness.  Musculoskeletal:     Cervical back: Normal range of motion.     Right lower leg: No edema.     Left lower leg: No edema.     Comments: Diffuse paralumbar muscular tenderness.  No focal bony tenderness to CT or L-spine  Skin:    General: Skin is warm and dry.     Capillary Refill: Capillary refill takes less than 2 seconds.  Neurological:     Mental Status: He is alert. Mental status is at baseline.     Comments: Bilateral lower extremities with normal sensation and strength with flexion-extension knees, normal gait and bilateral patellar reflexes are normal  Psychiatric:        Mood and Affect: Mood normal.         Behavior: Behavior normal.     ED Results / Procedures / Treatments   Labs (all labs ordered are listed, but only abnormal results are displayed) Labs Reviewed  URINALYSIS, ROUTINE W REFLEX MICROSCOPIC    EKG None  Radiology No results found.  Procedures Procedures    Medications Ordered in ED Medications  lidocaine (LIDODERM) 5 % 2 patch (2 patches Transdermal Patch Applied 12/03/22 0117)    ED Course/ Medical Decision Making/ A&P Clinical Course as of 12/03/22 0725  Sun Dec 03, 2022  0042 Th back pain, chills/fever after and vomited x1 Friday  No sx since [WF]    Clinical Course User Index [WF] Tedd Sias, PA                             Medical Decision Making Amount and/or Complexity of Data Reviewed Labs: ordered.  Risk Prescription drug management.   32 year old male present emergency room today with complaints of low back pain he states he has been having low back pain for the past 3 to 4 days.  He states that it is painful when he bends over and straightens out.  He denies any pain radiating down either leg.  No bowel or bladder incontinence.  No history of IV drug use any nausea vomiting or urinary symptoms.  He states he did have a fever 4 days ago of 1.4 but took 1 dose of Motrin and the fever never returned.  He denies any abdominal pain   Broad differential for back pain considered includes malignancy, disc herniation, spinal epidural abscess, spinal fracture, cauda equina, pyelonephritis, kidney stone, AAA, AD, pancreatitis, PE and PTX.   History without symptoms of urinary or stool retention or incontinence, neurologic changes such as sensation change or weakness lower extremities, coagulopathy or blood thinner use, is not elderly or with history of osteoporosis, denies any history of cancer, fever, IV drug use, weight changes (unexplained), or prolonged steroid use.   Physical exam most consistent with muscular strain. Doubt cauda equina or  disc herniation d/t lack of saddle anesthesia/bowel or bladder incontinence or urinary retention, normal gait and reassuring physical examination without neurologic deficits.   History is not supportive of kidney stone, AAA, AD, pancreatitis, PE or PTX. Patient has no CVA tenderness or urinary sx to suggest pyelonephritis or kidney stone.   Will manage patient conservatively at this time. NSAIDs, back exercises/stretches, heat therapy and follow up with PCP if symptoms do not resolve in 3-4  weeks. Patient offered muscle relaxer for comfort at night. Counseled on need to return to ED for fever, worsening or concerning symptoms. Patient agreeable to plan and states understanding of follow up plans and return precautions.      Vitals WNL at time of discharge and patient is no acute distress  Will discharge home with Lidoderm patches and Robaxin recommend patient to provide options discussed.  Follow-up with PCP   Final Clinical Impression(s) / ED Diagnoses Final diagnoses:  Acute low back pain without sciatica, unspecified back pain laterality    Rx / DC Orders ED Discharge Orders          Ordered    methocarbamol (ROBAXIN) 500 MG tablet  2 times daily        12/03/22 0253    lidocaine (LIDODERM) 5 %  Every 24 hours        12/03/22 0253              Tedd Sias, PA [id number]    Delora Fuel, MD [id number]

## 2022-12-03 NOTE — Discharge Instructions (Addendum)
Your urine sample is normal.  I recommend Tylenol and ibuprofen as discussed below.  Warm Epsom salt soaks, gentle massage, gentle stretching, Lidoderm patches  Please use Tylenol or ibuprofen for pain.  You may use 600 mg ibuprofen every 6 hours or 1000 mg of Tylenol every 6 hours.  You may choose to alternate between the 2.  This would be most effective.  Not to exceed 4 g of Tylenol within 24 hours.  Not to exceed 3200 mg ibuprofen 24 hours.    I have also written you a prescription for a muscle relaxer to use as needed if this helps your symptoms.  I recommend primarily using this at night it can cause drowsiness do not drive while taking this medication   I have also given you the information for primary care office to follow-up with

## 2022-12-13 NOTE — Patient Instructions (Addendum)
SURGICAL WAITING ROOM VISITATION  Patients having surgery or a procedure may have no more than 2 support people in the waiting area - these visitors may rotate.    Children under the age of 43 must have an adult with them who is not the patient.  Due to an increase in RSV and influenza rates and associated hospitalizations, children ages 25 and under may not visit patients in [place].  If the patient needs to stay at the hospital during part of their recovery, the visitor guidelines for inpatient rooms apply. Pre-op nurse will coordinate an appropriate time for 1 support person to accompany patient in pre-op.  This support person may not rotate.    Please refer to the [facility] website for the visitor guidelines for Inpatients (after your surgery is over and you are in a regular room).       Your procedure is scheduled on:  12/20/2022    Report to [facility] Main Entrance    Report to admitting at   1000AM   Call this number if you have problems the morning of surgery [phone]   Do not eat food :After Midnight.   After Midnight you may have the following liquids until __ 0900____ AM DAY OF SURGERY  Water Non-Citrus Juices (without pulp, NO RED-Apple, White grape, White cranberry) Black Coffee (NO MILK/CREAM OR CREAMERS, sugar ok)  Clear Tea (NO MILK/CREAM OR CREAMERS, sugar ok) regular and decaf                             Plain Jell-O (NO RED)                                           Fruit ices (not with fruit pulp, NO RED)                                     Popsicles (NO RED)                                                               Sports drinks like Gatorade (NO RED)                           If you have questions, please contact your surgeon's office.      Oral Hygiene is also important to reduce your risk of infection.                                    Remember - BRUSH YOUR TEETH THE MORNING OF SURGERY WITH YOUR REGULAR  TOOTHPASTE  DENTURES WILL BE REMOVED PRIOR TO SURGERY PLEASE DO NOT APPLY "Poly grip" OR ADHESIVES!!!   Do NOT smoke after Midnight   Take these medicines the morning of surgery with A SIP OF WATER:  none   DO NOT TAKE ANY ORAL DIABETIC MEDICATIONS DAY OF YOUR SURGERY  Bring CPAP mask and tubing day of surgery.  You may not have any metal on your body including hair pins, jewelry, and body piercing             Do not wear make-up, lotions, powders, perfumes/cologne, or deodorant  Do not wear nail polish including gel and S&S, artificial/acrylic nails, or any other type of covering on natural nails including finger and toenails. If you have artificial nails, gel coating, etc. that needs to be removed by a nail salon please have this removed prior to surgery or surgery may need to be canceled/ delayed if the surgeon/ anesthesia feels like they are unable to be safely monitored.   Do not shave  48 hours prior to surgery.               Men may shave face and neck.   Do not bring valuables to the hospital. [place].   Contacts, glasses, dentures or bridgework may not be worn into surgery.   Bring small overnight bag day of surgery.   DO NOT [place]. PHARMACY WILL DISPENSE MEDICATIONS LISTED ON YOUR MEDICATION LIST TO YOU DURING YOUR ADMISSION [place]!    Patients discharged on the day of surgery will not be allowed to drive home.  Someone NEEDS to stay with you for the first 24 hours after anesthesia.   Special Instructions: Bring a copy of your healthcare power of attorney and living will documents the day of surgery if you haven't scanned them before.              Please read over the following fact sheets you were given: IF [place] [phone]   If you received a COVID test during your pre-op visit  it is  requested that you wear a mask when out in public, stay away from anyone that may not be feeling well and notify your surgeon if you develop symptoms. If you test positive for Covid or have been in contact with anyone that has tested positive in the last 10 days please notify you surgeon. [facility] - Preparing for Surgery Before surgery, you can play an important role.  Because skin is not sterile, your skin needs to be as free of germs as possible.  You can reduce the number of germs on your skin by washing with CHG (chlorahexidine gluconate) soap before surgery.  CHG is an antiseptic cleaner which kills germs and bonds with the skin to continue killing germs even after washing. Please DO NOT use if you have an allergy to CHG or antibacterial soaps.  If your skin becomes reddened/irritated stop using the CHG and inform your nurse when you arrive at Short Stay. Do not shave (including legs and underarms) for at least 48 hours prior to the first CHG shower.  You may shave your face/neck. Please follow these instructions carefully:  1.  Shower with CHG Soap the night before surgery and the  morning of Surgery.  2.  If you choose to wash your hair, wash your hair first as usual with your  normal  shampoo.  3.  After you shampoo, rinse your hair and body thoroughly to remove the  shampoo.                           4.  Use  CHG as you would any other liquid soap.  You can apply chg directly  to the skin and wash                       Gently with a scrungie or clean washcloth.  5.  Apply the CHG Soap to your body ONLY FROM THE NECK DOWN.   Do not use on face/ open                           Wound or open sores. Avoid contact with eyes, ears mouth and genitals (private parts).                       Wash face,  Genitals (private parts) with your normal soap.             6.  Wash thoroughly, paying special attention to the area where your surgery  will be performed.  7.  Thoroughly rinse your body with warm  water from the neck down.  8.  DO NOT shower/wash with your normal soap after using and rinsing off  the CHG Soap.                9.  Pat yourself dry with a clean towel.            10.  Wear clean pajamas.            11.  Place clean sheets on your bed the night of your first shower and do not  sleep with pets. Day of Surgery : Do not apply any lotions/deodorants the morning of surgery.  Please wear clean clothes to the hospital/surgery center.  FAILURE TO FOLLOW THESE INSTRUCTIONS MAY RESULT IN THE CANCELLATION OF YOUR SURGERY PATIENT SIGNATURE_________________________________  NURSE SIGNATURE__________________________________  ________________________________________________________________________

## 2022-12-13 NOTE — Progress Notes (Addendum)
Anesthesia Review:  PCP: none  Cardiologist : none  Chest x-ray : EKG : 11/17/22  Echo : Stress test: Cardiac Cath :  Activity level: can do a flight of stairs without difficutly  Sleep Study/ CPAP : hx of sleep apnea years ago per pt no cpap  Fasting Blood Sugar :      / Checks Blood Sugar -- times a day:   Blood Thinner/ Instructions /Last Dose: ASA / Instructions/ Last Dose :    IN ED for low back pain on 12/02/22   Pt with recent hx of flu first week of March , 2024 and surgery was postponed until now.  Pt has no symptoms.  PT states he feels fine.     Blood pressure was 173/95 at preop appt.  PT denies any chest pain, shortness of breath, dizziness, or headache. PT able to check blood pressure at home.  Instructed pt to monitor blood pressure at home.  PT also instructed to obtain PCP .  PT voiced understanding.   Instructed pt at preop to contact pharmacy  to reconcile meds.  At [phone].  PT voiced understanding.

## 2022-12-15 ENCOUNTER — Other Ambulatory Visit: Payer: Self-pay

## 2022-12-15 ENCOUNTER — Encounter (HOSPITAL_COMMUNITY): Payer: Self-pay

## 2022-12-15 ENCOUNTER — Encounter (HOSPITAL_COMMUNITY)
Admission: RE | Admit: 2022-12-15 | Discharge: 2022-12-15 | Disposition: A | Discharge status: Home / Self Care | Payer: BLUE CROSS/BLUE SHIELD | Source: Ambulatory Visit | Attending: Surgery

## 2022-12-15 VITALS — BP 173/95 | HR 86 | Temp 98.5°F | Resp 16 | Ht 75.0 in | Wt 345.0 lb

## 2022-12-15 DIAGNOSIS — Z01812 Encounter for preprocedural laboratory examination: Secondary | ICD-10-CM | POA: Diagnosis present

## 2022-12-15 DIAGNOSIS — Z01818 Encounter for other preprocedural examination: Secondary | ICD-10-CM

## 2022-12-15 LAB — BASIC METABOLIC PANEL
Anion gap: 10 (ref 5–15)
BUN: 20 mg/dL (ref 6–20)
CO2: 25 mmol/L (ref 22–32)
Calcium: 9.2 mg/dL (ref 8.9–10.3)
Chloride: 104 mmol/L (ref 98–111)
Creatinine, Ser: 1.26 mg/dL — ABNORMAL HIGH (ref 0.61–1.24)
GFR, Estimated: >60 mL/min (ref >60)
Glucose, Bld: 96 mg/dL (ref 70–99)
Potassium: 3.8 mmol/L (ref 3.5–5.1)
Sodium: 139 mmol/L (ref 135–145)

## 2022-12-15 LAB — CBC
HCT: 37.8 % — ABNORMAL LOW (ref 39.0–52.0)
Hemoglobin: 12.7 g/dL — ABNORMAL LOW (ref 13.0–17.0)
MCH: 28.6 pg (ref 26.0–34.0)
MCHC: 33.6 g/dL (ref 30.0–36.0)
MCV: 85.1 fL (ref 80.0–100.0)
Platelets: 243 10*3/uL (ref 150–400)
RBC: 4.44 MIL/uL (ref 4.22–5.81)
RDW: 12.8 % (ref 11.5–15.5)
WBC: 9 10*3/uL (ref 4.0–10.5)
nRBC: 0 % (ref 0.0–0.2)

## 2022-12-20 ENCOUNTER — Encounter (HOSPITAL_COMMUNITY)
Admission: RE | Disposition: A | Discharge status: Home / Self Care | Payer: Self-pay | Source: Home / Self Care | Attending: Surgery

## 2022-12-20 ENCOUNTER — Other Ambulatory Visit: Payer: Self-pay

## 2022-12-20 ENCOUNTER — Ambulatory Visit (HOSPITAL_COMMUNITY): Payer: BLUE CROSS/BLUE SHIELD | Admitting: Anesthesiology

## 2022-12-20 ENCOUNTER — Ambulatory Visit (HOSPITAL_COMMUNITY): Payer: BLUE CROSS/BLUE SHIELD | Admitting: Physician Assistant

## 2022-12-20 ENCOUNTER — Encounter (HOSPITAL_COMMUNITY): Payer: Self-pay | Admitting: Surgery

## 2022-12-20 ENCOUNTER — Ambulatory Visit (HOSPITAL_COMMUNITY)
Admission: RE | Admit: 2022-12-20 | Discharge: 2022-12-20 | Disposition: A | Discharge status: Home / Self Care | Payer: BLUE CROSS/BLUE SHIELD | Attending: Surgery | Admitting: Surgery

## 2022-12-20 DIAGNOSIS — Z09 Encounter for follow-up examination after completed treatment for conditions other than malignant neoplasm: Secondary | ICD-10-CM | POA: Insufficient documentation

## 2022-12-20 DIAGNOSIS — Z6841 Body Mass Index (BMI) 40.0 and over, adult: Secondary | ICD-10-CM | POA: Insufficient documentation

## 2022-12-20 DIAGNOSIS — I10 Essential (primary) hypertension: Secondary | ICD-10-CM | POA: Diagnosis not present

## 2022-12-20 DIAGNOSIS — Z87891 Personal history of nicotine dependence: Secondary | ICD-10-CM | POA: Diagnosis not present

## 2022-12-20 DIAGNOSIS — Z01818 Encounter for other preprocedural examination: Secondary | ICD-10-CM

## 2022-12-20 DIAGNOSIS — L0501 Pilonidal cyst with abscess: Secondary | ICD-10-CM | POA: Insufficient documentation

## 2022-12-20 HISTORY — PX: PILONIDAL CYST EXCISION: SHX744

## 2022-12-20 SURGERY — EXCISION, PILONIDAL CYST, EXTENSIVE
Anesthesia: General | Site: Coccyx

## 2022-12-20 MED ORDER — LIDOCAINE HCL (PF) 2 % IJ SOLN
INTRAMUSCULAR | Status: AC
  Filled 2022-12-20: qty 5

## 2022-12-20 MED ORDER — OXYCODONE HCL 5 MG/5ML PO SOLN: 5.0000 mg | Freq: Once | ORAL | Status: DC | PRN

## 2022-12-20 MED ORDER — CHLORHEXIDINE GLUCONATE 0.12 % MT SOLN: 15.0000 mL | Freq: Once | OROMUCOSAL | Status: DC

## 2022-12-20 MED ORDER — ONDANSETRON HCL 4 MG/2ML IJ SOLN
INTRAMUSCULAR | Status: AC
  Filled 2022-12-20: qty 2

## 2022-12-20 MED ORDER — CHLORHEXIDINE GLUCONATE 0.12 % MT SOLN
15.0000 mL | Freq: Once | OROMUCOSAL | Status: AC
  Administered 2022-12-20: 15 mL via OROMUCOSAL

## 2022-12-20 MED ORDER — FENTANYL CITRATE (PF) 100 MCG/2ML IJ SOLN
INTRAMUSCULAR | Status: DC | PRN
  Administered 2022-12-20 (×2): 50 ug via INTRAVENOUS
  Administered 2022-12-20: 100 ug via INTRAVENOUS

## 2022-12-20 MED ORDER — OXYCODONE HCL 5 MG PO TABS
5.0000 mg | ORAL_TABLET | Freq: Four times a day (QID) | ORAL | 0 refills | Status: DC | PRN
Start: 2022-12-20 — End: 2024-01-07

## 2022-12-20 MED ORDER — 0.9 % SODIUM CHLORIDE (POUR BTL) OPTIME
TOPICAL | Status: DC | PRN
  Administered 2022-12-20: 1000 mL

## 2022-12-20 MED ORDER — ONDANSETRON HCL 4 MG/2ML IJ SOLN: 4.0000 mg | Freq: Once | INTRAMUSCULAR | Status: DC | PRN

## 2022-12-20 MED ORDER — KETOROLAC TROMETHAMINE 30 MG/ML IJ SOLN
INTRAMUSCULAR | Status: AC
  Filled 2022-12-20: qty 1

## 2022-12-20 MED ORDER — PROPOFOL 10 MG/ML IV BOLUS
INTRAVENOUS | Status: AC
  Filled 2022-12-20: qty 20

## 2022-12-20 MED ORDER — CEFAZOLIN IN SODIUM CHLORIDE 3-0.9 GM/100ML-% IV SOLN
3.0000 g | INTRAVENOUS | Status: AC
  Administered 2022-12-20: 3 g via INTRAVENOUS
  Filled 2022-12-20: qty 100

## 2022-12-20 MED ORDER — SUGAMMADEX SODIUM 200 MG/2ML IV SOLN
INTRAVENOUS | Status: DC | PRN
  Administered 2022-12-20: 400 mg via INTRAVENOUS

## 2022-12-20 MED ORDER — FENTANYL CITRATE (PF) 250 MCG/5ML IJ SOLN
INTRAMUSCULAR | Status: AC
  Filled 2022-12-20: qty 5

## 2022-12-20 MED ORDER — ORAL CARE MOUTH RINSE: 15.0000 mL | Freq: Once | OROMUCOSAL | Status: AC

## 2022-12-20 MED ORDER — DEXAMETHASONE SODIUM PHOSPHATE 10 MG/ML IJ SOLN
INTRAMUSCULAR | Status: DC | PRN
  Administered 2022-12-20: 10 mg via INTRAVENOUS

## 2022-12-20 MED ORDER — LACTATED RINGERS IV SOLN: INTRAVENOUS | Status: DC

## 2022-12-20 MED ORDER — CHLORHEXIDINE GLUCONATE CLOTH 2 % EX PADS: 6.0000 | MEDICATED_PAD | Freq: Once | CUTANEOUS | Status: DC

## 2022-12-20 MED ORDER — MIDAZOLAM HCL 2 MG/2ML IJ SOLN
INTRAMUSCULAR | Status: AC
  Filled 2022-12-20: qty 2

## 2022-12-20 MED ORDER — HYDROMORPHONE HCL 1 MG/ML IJ SOLN: 0.2500 mg | INTRAMUSCULAR | Status: DC | PRN

## 2022-12-20 MED ORDER — BUPIVACAINE HCL 0.25 % IJ SOLN
INTRAMUSCULAR | Status: AC
  Filled 2022-12-20: qty 1

## 2022-12-20 MED ORDER — DEXMEDETOMIDINE HCL IN NACL 80 MCG/20ML IV SOLN
INTRAVENOUS | Status: DC | PRN
  Administered 2022-12-20: 12 ug via BUCCAL

## 2022-12-20 MED ORDER — OXYCODONE HCL 5 MG PO TABS: 5.0000 mg | ORAL_TABLET | Freq: Once | ORAL | Status: DC | PRN

## 2022-12-20 MED ORDER — ONDANSETRON HCL 4 MG/2ML IJ SOLN
INTRAMUSCULAR | Status: DC | PRN
  Administered 2022-12-20: 4 mg via INTRAVENOUS

## 2022-12-20 MED ORDER — MIDAZOLAM HCL 5 MG/5ML IJ SOLN
INTRAMUSCULAR | Status: DC | PRN
  Administered 2022-12-20: 2 mg via INTRAVENOUS

## 2022-12-20 MED ORDER — PROPOFOL 10 MG/ML IV BOLUS
INTRAVENOUS | Status: DC | PRN
  Administered 2022-12-20: 350 mg via INTRAVENOUS
  Administered 2022-12-20: 50 mg via INTRAVENOUS

## 2022-12-20 MED ORDER — ORAL CARE MOUTH RINSE: 15.0000 mL | Freq: Once | OROMUCOSAL | Status: DC

## 2022-12-20 MED ORDER — ROCURONIUM BROMIDE 10 MG/ML (PF) SYRINGE
PREFILLED_SYRINGE | INTRAVENOUS | Status: DC | PRN
  Administered 2022-12-20: 80 mg via INTRAVENOUS

## 2022-12-20 MED ORDER — LIDOCAINE 2% (20 MG/ML) 5 ML SYRINGE
INTRAMUSCULAR | Status: DC | PRN
  Administered 2022-12-20: 100 mg via INTRAVENOUS

## 2022-12-20 MED ORDER — BUPIVACAINE HCL 0.25 % IJ SOLN
INTRAMUSCULAR | Status: DC | PRN
  Administered 2022-12-20: 10 mL

## 2022-12-20 MED ORDER — ACETAMINOPHEN 500 MG PO TABS
1000.0000 mg | ORAL_TABLET | ORAL | Status: AC
  Administered 2022-12-20: 1000 mg via ORAL
  Filled 2022-12-20: qty 2

## 2022-12-20 SURGICAL SUPPLY — 23 items
APL SKNCLS STERI-STRIP NONHPOA (GAUZE/BANDAGES/DRESSINGS)
BAG COUNTER SPONGE SURGICOUNT (BAG) IMPLANT
BAG SPNG CNTER NS LX DISP (BAG)
BENZOIN TINCTURE PRP APPL 2/3 (GAUZE/BANDAGES/DRESSINGS) IMPLANT
BRIEF MESH DISP LRG (UNDERPADS AND DIAPERS) ×1 IMPLANT
COVER SURGICAL LIGHT HANDLE (MISCELLANEOUS) ×1 IMPLANT
DRAIN PENROSE 0.5X18 (DRAIN) IMPLANT
ELECT REM PT RETURN 15FT ADLT (MISCELLANEOUS) ×1 IMPLANT
GAUZE SPONGE 4X4 12PLY STRL (GAUZE/BANDAGES/DRESSINGS) ×1 IMPLANT
GAUZE XEROFORM 1X8 LF (GAUZE/BANDAGES/DRESSINGS) IMPLANT
GLOVE BIO SURGEON STRL SZ7 (GLOVE) ×1 IMPLANT
GLOVE BIOGEL PI IND STRL 7.5 (GLOVE) ×1 IMPLANT
GOWN STRL REUS W/ TWL LRG LVL3 (GOWN DISPOSABLE) ×1 IMPLANT
GOWN STRL REUS W/TWL LRG LVL3 (GOWN DISPOSABLE) ×1
KIT BASIN OR (CUSTOM PROCEDURE TRAY) ×1 IMPLANT
KIT TURNOVER KIT A (KITS) IMPLANT
NS IRRIG 1000ML POUR BTL (IV SOLUTION) ×1 IMPLANT
PACK GENERAL/GYN (CUSTOM PROCEDURE TRAY) ×1 IMPLANT
SUT ETHILON 3 0 PS 1 (SUTURE) IMPLANT
SUT VIC AB 3-0 SH 18 (SUTURE) ×1 IMPLANT
TAPE CLOTH 4X10 WHT NS (GAUZE/BANDAGES/DRESSINGS) ×1 IMPLANT
TAPE PAPER 3X10 WHT MICROPORE (GAUZE/BANDAGES/DRESSINGS) IMPLANT
TOWEL OR 17X26 10 PK STRL BLUE (TOWEL DISPOSABLE) ×1 IMPLANT

## 2022-12-20 NOTE — Anesthesia Preprocedure Evaluation (Addendum)
Anesthesia Evaluation  Patient identified by MRN, date of birth, ID band Patient awake    Reviewed: Allergy & Precautions, NPO status , Patient's Chart, lab work & pertinent test results  Airway Mallampati: II       Dental no notable dental hx. (+) Teeth Intact, Dental Advisory Given   Pulmonary former smoker No further problems with OSA   Pulmonary exam normal breath sounds clear to auscultation       Cardiovascular hypertension, Normal cardiovascular exam Rhythm:Regular Rate:Normal     Neuro/Psych negative neurological ROS  negative psych ROS   GI/Hepatic negative GI ROS, Neg liver ROS,,,  Endo/Other    Morbid obesity  Renal/GU Renal InsufficiencyRenal disease  negative genitourinary   Musculoskeletal Chronic pilonidal cyst abscess   Abdominal  (+) + obese  Peds  Hematology negative hematology ROS (+)   Anesthesia Other Findings   Reproductive/Obstetrics                              Anesthesia Physical Anesthesia Plan  ASA: 2  Anesthesia Plan: General   Post-op Pain Management: Minimal or no pain anticipated   Induction: Intravenous  PONV Risk Score and Plan: 3 and Treatment may vary due to age or medical condition, Midazolam and Ondansetron  Airway Management Planned: Oral ETT  Additional Equipment: None  Intra-op Plan:   Post-operative Plan: Extubation in OR  Informed Consent: I have reviewed the patients History and Physical, chart, labs and discussed the procedure including the risks, benefits and alternatives for the proposed anesthesia with the patient or authorized representative who has indicated his/her understanding and acceptance.     Dental advisory given  Plan Discussed with: Anesthesiologist and CRNA  Anesthesia Plan Comments:         Anesthesia Quick Evaluation

## 2022-12-20 NOTE — Op Note (Signed)
Pre-op diagnosis: Chronic pilonidal cyst Postop diagnosis: Same Procedure performed: Pilonidal cystectomy Surgeon:Duchess Armendarez K Jhace Fennell Anesthesia: General Indications:This is a healthy 32 year old male who presents with a 3-year history of intermittent pilonidal abscess with swelling and drainage.  He has had 5 previous incision and drainage.  None of these were by a surgeon.  The most recent was in January of this year.  Since that time the opening has healed but he has developed swelling.  Last week it spontaneously burst and drained some bloody purulent fluid.  The drainage has stopped.  He presents now for elective pilonidal cystectomy to prevent recurrence.  Description of procedure: The patient is brought to the operating room placed in the supine position on the stretcher.  After an adequate level of general anesthesia was obtained, the patient was moved to a prone position on the operating table.  Appropriate padding and safety straps were placed.  The patient has a palpable 2 cm mass just to the right of his coccyx.  No other fluctuance is noted.  He has a few skin pits near this palpable mass.  No drainage is noted.  We prepped this entire area with Betadine and draped in sterile fashion.  A timeout was taken to ensure the proper patient and proper procedure.  I infiltrated the entire area with local anesthetic.  I made an elliptical incision to include the entire palpable mass as well as the midline skin pits.  Dissection was carried down to normal-appearing adipose tissue with cautery.  We remove the entire cyst intact.  No other areas of firmness or fluctuance were noted.  There does not seem to be any residual skin tunnels.  We irrigated the wound thoroughly and inspected for hemostasis.  We undermined the skin and subcutaneous tissues laterally.  We closed with multiple layers of interrupted figure-of-eight 3-0 Vicryl sutures.  3-0 Ethilon sutures were used in horizontal mattress fashion to close  the skin.  Xeroform and dry gauze were applied.  The patient is then moved back to supine position.  He was extubated and brought to the recovery room in stable condition.  All sponge, instrument, and needle counts are correct.  Wilmon Arms. Corliss Skains, MD, [facility] Surgery  General Surgery   12/20/2022 2:29 PM

## 2022-12-20 NOTE — H&P (Signed)
Chief Complaint: New Consultation (Pilonidal Cyst)       History of Present Illness: Dale Evans is a 32 y.o. male who is seen today as an office consultation at the request of Urgent Care for evaluation of New Consultation (Pilonidal Cyst) .     This is a healthy 32 year old male who presents with a 3-year history of intermittent pilonidal abscess with swelling and drainage.  He has had 5 previous incision and drainage.  None of these were by a surgeon.  The most recent was in January of this year.  Since that time the opening has healed but he has developed swelling.  Last week it spontaneously burst and drained some bloody purulent fluid.  The drainage has stopped.  He thinks that this area is beginning to swell some more.  Currently no tenderness.  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     Review of Systems: A complete review of systems was obtained from the patient.  I have reviewed this information and discussed as appropriate with the patient.  See HPI as well for other ROS.   Review of Systems  Constitutional: Negative.   HENT: Negative.    Eyes: Negative.   Respiratory: Negative.    Cardiovascular: Negative.   Gastrointestinal: Negative.   Genitourinary: Negative.   Musculoskeletal: Negative.   Skin: Negative.   Neurological: Negative.   Endo/Heme/Allergies: Negative.   Psychiatric/Behavioral: Negative.          Medical History: Past Medical History         Past Medical History:  Diagnosis Date   Hypertension               Patient Active Problem List  Diagnosis   Essential hypertension   Pilonidal abscess      Past Surgical History           Past Surgical History:  Procedure Laterality Date   tonsilectomy   2018        Allergies         Allergies  Allergen Reactions   Amoxicillin Nausea        No current outpatient medications on file prior to visit.    No  current facility-administered medications on file prior to visit.      Family History           Family History  Problem Relation Age of Onset   High blood pressure (Hypertension) Mother     Breast cancer Mother          Social History           Tobacco Use  Smoking Status Former   Types: Cigarettes  Smokeless Tobacco Never      Social History  Social History             Socioeconomic History   Marital status: Married  Tobacco Use   Smoking status: Former      Types: Cigarettes   Smokeless tobacco: Never  Vaping Use   Vaping Use: Never used  Substance and Sexual Activity   Alcohol use: Never   Drug use: Never        Objective:           Vitals:    11/13/22 0949  BP: 130/80  Pulse: 87  Temp: 36.2 C (97.2 F)  SpO2: 98%  Weight: (!) 160.9 kg (354 lb 12.8 oz)  Height:  190.5 cm (6\' 3" )  PainSc: 0-No pain    Body mass index is 44.35 kg/m.   Physical Exam    Constitutional:  WDWN in NAD, conversant, no obvious deformities; lying in bed comfortably Eyes:  Pupils equal, round; sclera anicteric; moist conjunctiva; no lid lag HENT:  Oral mucosa moist; good dentition  Neck:  No masses palpated, trachea midline; no thyromegaly Lungs:  CTA bilaterally; normal respiratory effort CV:  Regular rate and rhythm; no murmurs; extremities well-perfused with no edema Abd:  +bowel sounds, soft, non-tender, no palpable organomegaly; no palpable hernias Musc:  normal gait; no apparent clubbing or cyanosis in extremities Lymphatic:  No palpable cervical or axillary lymphadenopathy Skin:  Warm, dry; no sign of jaundice Just over the coccyx, there is a 2.5 x 2.0 cm scar with a firm palpable mass just underneath the skin.  No erythema.  No fluctuance.  No drainage.  Minimal tenderness.  No open skin pits. Psychiatric - alert and oriented x 4; calm mood and affect   Assessment and Plan:  Diagnoses and all orders for this visit:   Pilonidal abscess       Patient has a  chronic pilonidal abscess with intermittent swelling and drainage.  Recommend pilonidal cystectomy for definitive treatment.The surgical procedure has been discussed with the patient.  Potential risks, benefits, alternative treatments, and expected outcomes have been explained.  All of the patient's questions at this time have been answered.  The likelihood of reaching the patient's treatment goal is good.  The patient understand the proposed surgical procedure and wishes to proceed.   Wilmon Arms. Corliss Skains, MD, [facility]  General Surgery   12/20/2022 11:16 AM

## 2022-12-20 NOTE — Anesthesia Procedure Notes (Signed)
Procedure Name: Intubation Date/Time: 12/20/2022 1:42 PM  Performed by: Basilio Cairo, CRNAPre-anesthesia Checklist: Patient identified, Patient being monitored, Timeout performed, Emergency Drugs available and Suction available Patient Re-evaluated:Patient Re-evaluated prior to induction Oxygen Delivery Method: Circle system utilized Preoxygenation: Pre-oxygenation with 100% oxygen Induction Type: IV induction and Cricoid Pressure applied Ventilation: Two handed mask ventilation required Laryngoscope Size: Mac and 4 Grade View: Grade II Tube type: Oral Tube size: 7.5 mm Number of attempts: 2 Airway Equipment and Method: Stylet Placement Confirmation: ETT inserted through vocal cords under direct vision, positive ETCO2 and breath sounds checked- equal and bilateral Secured at: 23 cm Tube secured with: Tape Dental Injury: Teeth and Oropharynx as per pre-operative assessment  Difficulty Due To: Difficulty was unanticipated

## 2022-12-20 NOTE — Anesthesia Postprocedure Evaluation (Signed)
Anesthesia Post Note  Patient: Dale Evans  Procedure(s) Performed: PILONIDAL CYSTECTOMY (Coccyx)     Patient location during evaluation: PACU Anesthesia Type: General Level of consciousness: awake and alert and oriented Pain management: pain level controlled Vital Signs Assessment: post-procedure vital signs reviewed and stable Respiratory status: spontaneous breathing, nonlabored ventilation and respiratory function stable Cardiovascular status: blood pressure returned to baseline and stable Postop Assessment: no apparent nausea or vomiting Anesthetic complications: yes   Encounter Notable Events  Notable Event Outcome Phase Comment  Difficult to intubate - unexpected  Intraprocedure Filed from anesthesia note documentation.    Last Vitals:  Vitals:   12/20/22 1145 12/20/22 1436  BP: (!) 157/104 139/85  Pulse:  74  Resp: 16 15  Temp:  36.8 C  SpO2: 100% 96%    Last Pain:  Vitals:   12/20/22 1436  TempSrc:   PainSc: 0-No pain                 Dorea Duff A.

## 2022-12-20 NOTE — Discharge Instructions (Signed)
[facility]   POST OP INSTRUCTIONS: POST OP INSTRUCTIONS  Always review your discharge instruction sheet given to you by the facility where your surgery was performed. IF YOU HAVE DISABILITY OR FAMILY LEAVE FORMS, YOU MUST BRING THEM TO THE OFFICE FOR PROCESSING.   DO NOT GIVE THEM TO YOUR DOCTOR.  A  prescription for pain medication may be given to you upon discharge.  Take your pain medication as prescribed, if needed.  If narcotic pain medicine is not needed, then you may take acetaminophen (Tylenol) or ibuprofen (Advil) as needed. Take your usually prescribed medications unless otherwise directed. If you need a refill on your pain medication, please contact your pharmacy.  They will contact our office to request authorization. Prescriptions will not be filled after 5 pm or on week-ends. You should follow a light diet the first 48 hours after arrival home, such as soup and crackers, etc.  Be sure to include lots of fluids daily.  Resume your normal diet 2-3 days after surgery.. Most patients will experience some swelling and discomfort around the incision. Ice packs will help.  Swelling and discomfort can take several days to resolve.  It is common to experience some constipation if taking pain medication after surgery.  Increasing fluid intake and taking a stool softener (such as Colace) will usually help or prevent this problem from occurring.  A mild laxative (Milk of Magnesia or Miralax) should be taken according to package directions if there are no bowel movements after 48 hours. You should wear a dry gauze pad taped over the suture line.  This needs to be changed daily and as needed.  You may shower but should leave the dressing on while showering.  You should expect drainage for the first several days. ACTIVITIES:  You may resume regular (light) daily activities beginning the next day--such as daily self-care, walking, climbing stairs--gradually increasing  activities as tolerated.  You may have sexual intercourse when it is comfortable.  Refrain from any heavy lifting or straining until approved by your doctor. You may drive when you are no longer taking prescription pain medication, you can comfortably wear a seatbelt, and you can safely maneuver your car and apply brakes. RETURN TO WORK: : ____________________  Bonita Quin should see your doctor in the office for a follow-up appointment approximately 2-3 weeks after your surgery.  Make sure that you call for this appointment within a day or two after you arrive home to insure a convenient appointment time. OTHER INSTRUCTIONS:  __________________________________________________________________________________________________________________________________________________________________________________________  WHEN TO CALL YOUR DOCTOR: Fever over 101.0 Inability to urinate Nausea and/or vomiting Extreme swelling or bruising Continued bleeding from rectum. Increased pain, redness, or drainage from the incision Constipation  The clinic staff is available to answer your questions during regular business hours.  Please don't hesitate to call and ask to speak to one of the nurses for clinical concerns.  If you have a medical emergency, go to the nearest emergency room or call 911.  A surgeon from [facility] is always on call at the hospital   [address], [address], [place], [place]  [address] ?  [address] [address], [place], [place]   [address] [phone] ? [phone] ? FAX [phone] Web site: [email or web address]

## 2022-12-20 NOTE — Transfer of Care (Signed)
Immediate Anesthesia Transfer of Care Note  Patient: Linsey D Shehata  Procedure(s) Performed: Procedure(s): PILONIDAL CYSTECTOMY (N/A)  Patient Location: PACU  Anesthesia Type:General  Level of Consciousness: Alert, Awake, Oriented  Airway & Oxygen Therapy: Patient Spontanous Breathing  Post-op Assessment: Report given to RN  Post vital signs: Reviewed and stable  Last Vitals:  Vitals:   12/20/22 1050 12/20/22 1058  BP: (!) 222/126 (!) 171/108  Pulse: 61   Resp: 18   Temp: 36.7 C   SpO2: 97%     Complications: No apparent anesthesia complications

## 2022-12-21 ENCOUNTER — Encounter (HOSPITAL_COMMUNITY): Payer: Self-pay | Admitting: Surgery

## 2022-12-21 LAB — SURGICAL PATHOLOGY

## 2023-07-22 ENCOUNTER — Emergency Department (HOSPITAL_COMMUNITY)
Admission: EM | Admit: 2023-07-22 | Discharge: 2023-07-23 | Disposition: A | Discharge status: Home / Self Care | Payer: BLUE CROSS/BLUE SHIELD | Attending: Emergency Medicine | Admitting: Emergency Medicine

## 2023-07-22 ENCOUNTER — Other Ambulatory Visit: Payer: Self-pay

## 2023-07-22 DIAGNOSIS — Z79899 Other long term (current) drug therapy: Secondary | ICD-10-CM | POA: Insufficient documentation

## 2023-07-22 DIAGNOSIS — L0100 Impetigo, unspecified: Secondary | ICD-10-CM | POA: Insufficient documentation

## 2023-07-22 NOTE — ED Triage Notes (Signed)
Pt arrives to ED c/o facial rash/bumps on bottom lip since Monday. Pt has family member in house with impetigo. Pt reports sore has drainage.

## 2023-07-23 MED ORDER — MUPIROCIN 2 % EX OINT: 1.0000 | TOPICAL_OINTMENT | Freq: Two times a day (BID) | CUTANEOUS | 0 refills | Status: AC

## 2023-07-23 NOTE — ED Notes (Signed)
 This RN reviewed discharge instructions with patient. He verbalized understanding and denied any further questions. PT well appearing upon discharge and reports no pain. Pt ambulated with stable gait to exit. Pt endorses ride home.

## 2023-07-23 NOTE — ED Provider Notes (Signed)
[place] EMERGENCY DEPARTMENT AT [facility] Provider Note   CSN: [id number] Arrival date & time: 07/22/23  2236     History  Chief Complaint  Patient presents with   Rash    Dale Evans is a 32 y.o. male, no pertinent past medical history, who presents to the ED secondary to rash on the right side of his face, right nostril, and left ear that is been going on for the last couple days.  He states his daughter was recently diagnosed with impetigo, and that he thinks he has it as well.  Denies any sore throat, cough, lesions around the eyes.     Home Medications Prior to Admission medications   Medication Sig Start Date End Date Taking? Authorizing Provider  mupirocin ointment (BACTROBAN) 2 % Apply 1 Application topically 2 (two) times daily for 5 days. 07/23/23 07/28/23 Yes Raynor Calcaterra L, PA  amLODipine (NORVASC) 5 MG tablet Take 1 tablet (5 mg total) by mouth daily. 11/07/22   Carlisle Beers, FNP  methocarbamol (ROBAXIN) 500 MG tablet Take 1 tablet (500 mg total) by mouth 2 (two) times daily. Patient taking differently: Take 500 mg by mouth 2 (two) times daily as needed for muscle spasms. 12/03/22   Gailen Shelter, PA  Omega-3 Fatty Acids (FISH OIL PO) Take 1 capsule by mouth daily.    [provider]  OVER THE COUNTER MEDICATION Take 2 capsules by mouth daily. muscletech alpha test    [provider]  oxyCODONE (OXY IR/ROXICODONE) 5 MG immediate release tablet Take 1 tablet (5 mg total) by mouth every 6 (six) hours as needed for severe pain. 12/20/22   Manus Rudd, MD      Allergies    Licorice flavor [artificial licorice flavor], Amoxicillin, and Bee venom    Review of Systems   Review of Systems  Constitutional:  Negative for fever.  Skin:  Positive for rash.    Physical Exam Updated Vital Signs BP (!) 172/119 (BP Location: Right Arm)   Pulse 60   Temp 98.3 F (36.8 C)   Resp 18   Ht 6\' 3"  (1.905 m)   Wt (!) 165.6 kg    SpO2 100%   BMI 45.62 kg/m  Physical Exam Vitals and nursing note reviewed.  Constitutional:      General: He is not in acute distress.    Appearance: He is well-developed.  HENT:     Head: Normocephalic and atraumatic.  Eyes:     Conjunctiva/sclera: Conjunctivae normal.  Cardiovascular:     Rate and Rhythm: Normal rate and regular rhythm.     Heart sounds: No murmur heard. Pulmonary:     Effort: Pulmonary effort is normal. No respiratory distress.     Breath sounds: Normal breath sounds.  Abdominal:     Palpations: Abdomen is soft.     Tenderness: There is no abdominal tenderness.  Musculoskeletal:        General: No swelling.     Cervical back: Neck supple.  Skin:    General: Skin is warm and dry.     Capillary Refill: Capillary refill takes less than 2 seconds.     Comments: Honey colored crust, on right chin, as well as left ear, no lesions noted around the nostrils, or the rest of the face.  Neurological:     Mental Status: He is alert.  Psychiatric:        Mood and Affect: Mood normal.     ED  Results / Procedures / Treatments   Labs (all labs ordered are listed, but only abnormal results are displayed) Labs Reviewed - No data to display  EKG None  Radiology No results found.  Procedures Procedures    Medications Ordered in ED Medications - No data to display  ED Course/ Medical Decision Making/ A&P                                 Medical Decision Making Patient's picture clinically fits impetigo, he has limited number of lesions, slight amount in the ear, the right face.  Given limited lesions we will treat topically, and have him follow-up with his primary care doctor.  If not improving he will require an oral antibiotic.  We discussed return precautions and he voiced understanding.  Discharged home.  No evidence of mucosal bleeding, fevers, or conjunctival injection.   Final Clinical Impression(s) / ED Diagnoses Final diagnoses:  Impetigo     Rx / DC Orders ED Discharge Orders          Ordered    mupirocin ointment (BACTROBAN) 2 %  2 times daily        07/23/23 0653              Aurilla Coulibaly Elbert Ewings, PA 07/23/23 0656    Sloan Leiter, DO 07/26/23 1857

## 2023-07-23 NOTE — Discharge Instructions (Addendum)
Please follow-up with your primary care doctor, please put the ointment on your lesions twice a day for the last for the next 5 days.  If they are not improving please follow-up with a primary care doctor to receive an oral antibiotic.  Typically the ointment is effective however, if you have limited lesions which you do.

## 2023-10-18 ENCOUNTER — Encounter (HOSPITAL_COMMUNITY): Payer: Self-pay

## 2023-10-18 ENCOUNTER — Ambulatory Visit (HOSPITAL_COMMUNITY)
Admission: EM | Admit: 2023-10-18 | Discharge: 2023-10-18 | Disposition: A | Discharge status: Home / Self Care | Payer: Self-pay | Attending: Physician Assistant | Admitting: Physician Assistant

## 2023-10-18 DIAGNOSIS — I10 Essential (primary) hypertension: Secondary | ICD-10-CM

## 2023-10-18 DIAGNOSIS — B001 Herpesviral vesicular dermatitis: Secondary | ICD-10-CM

## 2023-10-18 MED ORDER — VALACYCLOVIR HCL 1 G PO TABS: 1000.0000 mg | ORAL_TABLET | Freq: Two times a day (BID) | ORAL | 0 refills | Status: AC

## 2023-10-18 MED ORDER — AMLODIPINE BESYLATE 10 MG PO TABS: 10.0000 mg | ORAL_TABLET | Freq: Every day | ORAL | 0 refills | Status: DC

## 2023-10-18 NOTE — ED Triage Notes (Signed)
 Pt presents with a cold sore inside of lip. Pt states now the bump is on his bottom lip.

## 2023-10-18 NOTE — Discharge Instructions (Signed)
 We are treating you for cold sores.  Start valacyclovir  twice daily for 7 days.  Use over-the-counter medication including Tylenol  and ibuprofen  for discomfort.  If your symptoms are not improving or if anything worsens and you have sores in your mouth, difficulty swallowing, swelling of her throat, shortness of breath, change in your voice you need to be seen immediately.  Start amlodipine  10 mg daily.  Avoid decongestants, caffeine, sodium.  You will need to follow-up with primary care to ensure that we do not need to start a second medication.  Monitor your blood pressure at home and keep a log for evaluation of follow-up appointment.  If anything worsens and you have chest pain, shortness of breath, vision change, dizziness, swelling in your legs in the setting of high blood pressure you need to be seen immediately.

## 2023-10-18 NOTE — ED Provider Notes (Signed)
 [facility]    CSN: [id number] Arrival date & time: 10/18/23  1858      History   Chief Complaint Chief Complaint  Patient presents with   Rash    HPI Dale Evans is a 33 y.o. male.   Patient presents today with a several day history of blisters and lesions involving his lips and mouth.  He had a similar episode several weeks ago that resolved with Abreva only to have recurrence.  He does have a history of cold sores in the past but has not had issues recently.  He denies any significant pain.  He denies any recent medication changes.  He denies history of immunosuppression or chemotherapy.  He is eating and drinking normally.  He has previously required Valtrex  and believes that given his recurrent symptoms he needs oral medication to manage the outbreak.  His blood pressure is very elevated.  He denies any chest pain, shortness of breath, headache, vision change, dizziness.  Denies any increased sodium or caffeine consumption.  He has previously been prescribed amlodipine  but has not taken this recently.  He is not currently of a primary care.  He is open to restarting medication.    Past Medical History:  Diagnosis Date   Bronchitis    Hypertension    Sleep apnea    hx of years ago per pt not now   Vertigo     Patient Active Problem List   Diagnosis Date Noted   Essential hypertension 11/07/2022    Past Surgical History:  Procedure Laterality Date   ABCESS DRAINAGE     Pilonidal cyst x5   KNEE SURGERY     PILONIDAL CYST EXCISION N/A 12/20/2022   Procedure: PILONIDAL CYSTECTOMY;  Surgeon: Belinda Cough, MD;  Location: [facility];  Service: General;  Laterality: N/A;   TONSILLECTOMY         Home Medications    Prior to Admission medications   Medication Sig Start Date End Date Taking? Authorizing Provider  amLODipine  (NORVASC ) 10 MG tablet Take 1 tablet (10 mg total) by mouth daily. 10/18/23  Yes Graeden Bitner K, PA-C  valACYclovir  (VALTREX ) 1000 MG  tablet Take 1 tablet (1,000 mg total) by mouth 2 (two) times daily for 7 days. 10/18/23 10/25/23 Yes Cristle Jared K, PA-C  methocarbamol  (ROBAXIN ) 500 MG tablet Take 1 tablet (500 mg total) by mouth 2 (two) times daily. Patient taking differently: Take 500 mg by mouth 2 (two) times daily as needed for muscle spasms. 12/03/22   Neldon Hamp RAMAN, PA  Omega-3 Fatty Acids (FISH OIL PO) Take 1 capsule by mouth daily.    [provider]  OVER THE COUNTER MEDICATION Take 2 capsules by mouth daily. muscletech alpha test    [provider]  oxyCODONE  (OXY IR/ROXICODONE ) 5 MG immediate release tablet Take 1 tablet (5 mg total) by mouth every 6 (six) hours as needed for severe pain. 12/20/22   Belinda Cough, MD    Family History Family History  Problem Relation Age of Onset   Healthy Mother    Cancer Mother    Hypertension Mother    Healthy Father    Diabetes Father    Hypertension Maternal Grandmother    Stroke Maternal Grandmother     Social History Social History   Tobacco Use   Smoking status: Former    Types: Cigars   Smokeless tobacco: Never  Vaping Use   Vaping status: Never Used  Substance Use Topics   Alcohol  use: No   Drug use: Never    Comment: former user of CBD last used 12/11/19     Allergies   Licorice flavor [licorice flavoring agent (non-screening)], Amoxicillin, and Bee venom   Review of Systems Review of Systems  Constitutional:  Negative for activity change, appetite change, fatigue and fever.  HENT:  Positive for mouth sores. Negative for congestion, sinus pressure, sneezing and sore throat.   Eyes:  Negative for visual disturbance.  Respiratory:  Negative for cough and shortness of breath.   Cardiovascular:  Negative for chest pain, palpitations and leg swelling.  Neurological:  Negative for dizziness, light-headedness and headaches.     Physical Exam Triage Vital Signs ED Triage Vitals  Encounter Vitals Group     BP 10/18/23 2008 (!)  181/115     Systolic BP Percentile --      Diastolic BP Percentile --      Pulse Rate 10/18/23 2006 84     Resp 10/18/23 2006 17     Temp 10/18/23 2006 98.7 F (37.1 C)     Temp Source 10/18/23 2006 Oral     SpO2 10/18/23 2006 95 %     Weight --      Height --      Head Circumference --      Peak Flow --      Pain Score 10/18/23 2006 0     Pain Loc --      Pain Education --      Exclude from Growth Chart --    No data found.  Updated Vital Signs BP (!) 181/115   Pulse 84   Temp 98.7 F (37.1 C) (Oral)   Resp 17   SpO2 95%   Visual Acuity Right Eye Distance:   Left Eye Distance:   Bilateral Distance:    Right Eye Near:   Left Eye Near:    Bilateral Near:     Physical Exam Vitals reviewed.  Constitutional:      General: He is awake.     Appearance: Normal appearance. He is well-developed. He is not ill-appearing.     Comments: Very pleasant male appears stated age in no acute distress sitting comfortably in exam room  HENT:     Head: Normocephalic and atraumatic.     Mouth/Throat:     Lips: Lesions present.     Pharynx: Uvula midline. No oropharyngeal exudate or posterior oropharyngeal erythema.      Comments: Vesicular lesion noted right upper vermilion border.  Ulcerative lesion noted in her upper lip. Eyes:     Extraocular Movements: Extraocular movements intact.     Conjunctiva/sclera: Conjunctivae normal.  Cardiovascular:     Rate and Rhythm: Normal rate and regular rhythm.     Heart sounds: Normal heart sounds, S1 normal and S2 normal. No murmur heard. Pulmonary:     Effort: Pulmonary effort is normal.     Breath sounds: Normal breath sounds. No stridor. No wheezing, rhonchi or rales.     Comments: Clear to auscultation bilaterally Neurological:     Mental Status: He is alert.  Psychiatric:        Behavior: Behavior is cooperative.      UC Treatments / Results  Labs (all labs ordered are listed, but only abnormal results are displayed) Labs  Reviewed - No data to display  EKG   Radiology No results found.  Procedures Procedures (including critical care time)  Medications Ordered in UC Medications - No data to  display  Initial Impression / Assessment and Plan / UC Course  I have reviewed the triage vital signs and the nursing notes.  Pertinent labs & imaging results that were available during my care of the patient were reviewed by me and considered in my medical decision making (see chart for details).     Symptoms consistent with herpes labialis.  Given he has already been taking over-the-counter medications and had recurrent symptoms we will treat with 7 days of valacyclovir .  He can use over-the-counter medications for symptom relief.  We discussed that if he continues to have recurrent symptoms despite this medication and may be worthwhile to consider suppressive therapy in the future.  Discussed that if anything worsens and he has additional lesions or sores in his mouth, dysphagia, swelling of his throat, shortness of breath, muffled voice he needs to be seen emergently.  Strict return precautions given.  His blood pressure was significantly elevated.  It has been elevated the last several visits.  Discussed the importance of compliance with antihypertensive medication.  He is agreeable and so we will start amlodipine  10 mg.  Discussed that he will likely need a second agent this would be arranged with his primary care.  He does not currently have a PCP so we will try to establish him with 1 via PCP assistance.  He was encouraged to avoid decongestants, caffeine, sodium.  Recommended he monitor his blood pressure at home.  Discussed that if this is persistently elevated or if he develops any chest pain, shortness of breath, headache, vision change, dizziness in setting of high blood pressure he needs to be seen emergently.  Strict return precautions given.  All questions answered to patient satisfaction.  Final Clinical  Impressions(s) / UC Diagnoses   Final diagnoses:  Herpes labialis  Elevated blood pressure reading in office with diagnosis of hypertension     Discharge Instructions      We are treating you for cold sores.  Start valacyclovir  twice daily for 7 days.  Use over-the-counter medication including Tylenol  and ibuprofen  for discomfort.  If your symptoms are not improving or if anything worsens and you have sores in your mouth, difficulty swallowing, swelling of her throat, shortness of breath, change in your voice you need to be seen immediately.  Start amlodipine  10 mg daily.  Avoid decongestants, caffeine, sodium.  You will need to follow-up with primary care to ensure that we do not need to start a second medication.  Monitor your blood pressure at home and keep a log for evaluation of follow-up appointment.  If anything worsens and you have chest pain, shortness of breath, vision change, dizziness, swelling in your legs in the setting of high blood pressure you need to be seen immediately.    ED Prescriptions     Medication Sig Dispense Auth. Provider   valACYclovir  (VALTREX ) 1000 MG tablet Take 1 tablet (1,000 mg total) by mouth 2 (two) times daily for 7 days. 14 tablet Jomarie Gellis K, PA-C   amLODipine  (NORVASC ) 10 MG tablet Take 1 tablet (10 mg total) by mouth daily. 90 tablet Ajai Harville K, PA-C      PDMP not reviewed this encounter.   Sherrell Rocky MARLA DEVONNA 10/18/23 2034

## 2024-01-07 ENCOUNTER — Other Ambulatory Visit: Payer: Self-pay

## 2024-01-07 ENCOUNTER — Encounter (HOSPITAL_COMMUNITY): Payer: Self-pay | Admitting: Emergency Medicine

## 2024-01-07 ENCOUNTER — Ambulatory Visit (HOSPITAL_COMMUNITY)
Admission: EM | Admit: 2024-01-07 | Discharge: 2024-01-07 | Disposition: A | Discharge status: Home / Self Care | Payer: Self-pay | Attending: Emergency Medicine | Admitting: Emergency Medicine

## 2024-01-07 ENCOUNTER — Ambulatory Visit (INDEPENDENT_AMBULATORY_CARE_PROVIDER_SITE_OTHER): Payer: Self-pay

## 2024-01-07 DIAGNOSIS — S99922A Unspecified injury of left foot, initial encounter: Secondary | ICD-10-CM

## 2024-01-07 DIAGNOSIS — I10 Essential (primary) hypertension: Secondary | ICD-10-CM

## 2024-01-07 MED ORDER — AMLODIPINE BESYLATE 10 MG PO TABS: 10.0000 mg | ORAL_TABLET | Freq: Every day | ORAL | 0 refills | Status: DC

## 2024-01-07 NOTE — Discharge Instructions (Signed)
 Rest - try to avoid high impact activity Ice - apply for 20 minutes a few times daily Compression - use ace wrap for support Elevation - prop up on a pillow  Ibuprofen  can be alternated with tylenol  for pain Please follow with orthopedics if pain persists over a week despite these interventions  I have refilled your amlodipine  with a 30 day supply. Scan the QR code on the last page to get established with a primary care provider as soon as able!

## 2024-01-07 NOTE — ED Triage Notes (Signed)
 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  Stepped on left foot awkwardly after a match.  Wrestled Saturday.    Today , woke with extreme pain.  Has used tylenol  and pain patches on left foot.  Points to lateral foot, area behind little toe and next toe.    Pain worsens with weight bearing

## 2024-01-07 NOTE — ED Provider Notes (Signed)
 [facility]    CSN: [id number] Arrival date & time: 01/07/24  1916     History   Chief Complaint Chief Complaint  Patient presents with   Foot Injury    HPI Dale Evans is a 33 y.o. male.  2 days ago was wresting, landed odd on his left foot Was okay yesterday but this morning woke with severe pain on the top and side of the foot. Has used tylenol  and topical pain patch. Rating pain 10/10 Worse with weight bearing  Reports broke this ankle in the past but no foot injuries   Past Medical History:  Diagnosis Date   Bronchitis    Hypertension    Sleep apnea    hx of years ago per pt not now   Vertigo     Patient Active Problem List   Diagnosis Date Noted   Essential hypertension 11/07/2022    Past Surgical History:  Procedure Laterality Date   ABCESS DRAINAGE     Pilonidal cyst x5   KNEE SURGERY     PILONIDAL CYST EXCISION N/A 12/20/2022   Procedure: PILONIDAL CYSTECTOMY;  Surgeon: Dareen Ebbing, MD;  Location: [facility];  Service: General;  Laterality: N/A;   TONSILLECTOMY       Home Medications    Prior to Admission medications   Medication Sig Start Date End Date Taking? Authorizing Provider  amLODipine  (NORVASC ) 10 MG tablet Take 1 tablet (10 mg total) by mouth daily. 01/07/24   Hilliary Jock, Beth Brooke    Family History Family History  Problem Relation Age of Onset   Healthy Mother    Cancer Mother    Hypertension Mother    Healthy Father    Diabetes Father    Hypertension Maternal Grandmother    Stroke Maternal Grandmother     Social History Social History   Tobacco Use   Smoking status: Former    Types: Cigars   Smokeless tobacco: Never  Vaping Use   Vaping status: Never Used  Substance Use Topics   Alcohol use: No   Drug use: Never    Comment: former user of CBD last used 12/11/19     Allergies   Licorice flavor [licorice flavoring agent (non-screening)], Amoxicillin, and Bee venom   Review of Systems Review of  Systems Per HPI  Physical Exam Triage Vital Signs ED Triage Vitals  Encounter Vitals Group     BP 01/07/24 2035 (!) 148/92     Systolic BP Percentile --      Diastolic BP Percentile --      Pulse Rate 01/07/24 2035 69     Resp 01/07/24 2035 20     Temp 01/07/24 2035 98.6 F (37 C)     Temp Source 01/07/24 2035 Oral     SpO2 01/07/24 2035 96 %     Weight --      Height --      Head Circumference --      Peak Flow --      Pain Score 01/07/24 2033 10     Pain Loc --      Pain Education --      Exclude from Growth Chart --    No data found.  Updated Vital Signs BP (!) 148/92 (BP Location: Right Arm) Comment (BP Location): large cuff, forearm  Pulse 69   Temp 98.6 F (37 C) (Oral)   Resp 20   SpO2 96%    Physical Exam Vitals and nursing note reviewed.  Constitutional:  General: He is not in acute distress. HENT:     Mouth/Throat:     Pharynx: Oropharynx is clear.  Cardiovascular:     Rate and Rhythm: Normal rate and regular rhythm.     Pulses: Normal pulses.     Heart sounds: Normal heart sounds.  Pulmonary:     Effort: Pulmonary effort is normal.     Breath sounds: Normal breath sounds.  Musculoskeletal:     Left foot: Swelling and tenderness present.       Feet:     Comments: Left foot with some swelling and tenderness midfoot to lateral. Slight decreased sensation in 4th and 5th toes. ROM intact. Cap refill < 2 seconds. DP pulse 2+  Skin:    General: Skin is warm and dry.     Capillary Refill: Capillary refill takes less than 2 seconds.  Neurological:     Mental Status: He is alert and oriented to person, place, and time.      UC Treatments / Results  Labs (all labs ordered are listed, but only abnormal results are displayed) Labs Reviewed - No data to display  EKG   Radiology DG Foot Complete Left Result Date: 01/07/2024 CLINICAL DATA:  Left foot injury with extreme pain greatest laterally. EXAM: LEFT FOOT - COMPLETE 3+ VIEW COMPARISON:   None Available. FINDINGS: Three views.  There is no evidence of acute fracture or dislocation. There is a small ossicle medial to the distal great toe first metatarsal which is probably a chronic chip fracture with nonunion. There is an accessory os peroneum. There is no evidence of arthropathy or other focal bone abnormality. There is mild edema throughout the foot. IMPRESSION: 1. No evidence of acute fracture or dislocation. Mild edema throughout the foot. 2. Small ossicle medial to the distal great toe first metatarsal which is probably a chronic chip fracture with nonunion. Electronically Signed   By: Denman Fischer M.D.   On: 01/07/2024 21:13    Procedures Procedures (including critical care time)  Medications Ordered in UC Medications - No data to display  Initial Impression / Assessment and Plan / UC Course  I have reviewed the triage vital signs and the nursing notes.  Pertinent labs & imaging results that were available during my care of the patient were reviewed by me and considered in my medical decision making (see chart for details).   Xray without bony abnormality. Images independently reviewed by me, agree with radiology interpretation. Discussed sprain. Ace wrap applied. RICE therapy and pain control. Ortho info provided for follow up if needed Work note provided  Patient also requesting refill of amlodipine  10 mg. Does not have PCP yet but working on it as just started new job. QR code provided to establish as soon as able, 30 day supply of amlodipine  sent  Final Clinical Impressions(s) / UC Diagnoses   Final diagnoses:  Injury of left foot, initial encounter  Hypertension, unspecified type     Discharge Instructions      Rest - try to avoid high impact activity Ice - apply for 20 minutes a few times daily Compression - use ace wrap for support Elevation - prop up on a pillow  Ibuprofen  can be alternated with tylenol  for pain Please follow with orthopedics if  pain persists over a week despite these interventions  I have refilled your amlodipine  with a 30 day supply. Scan the QR code on the last page to get established with a primary care provider as soon as  able!    ED Prescriptions     Medication Sig Dispense Auth. Provider   amLODipine  (NORVASC ) 10 MG tablet Take 1 tablet (10 mg total) by mouth daily. 30 tablet Geran Haithcock, Ivette Marks, PA-C      PDMP not reviewed this encounter.   Mirha Brucato, Beth Brooke 01/07/24 2134

## 2024-02-06 ENCOUNTER — Ambulatory Visit (HOSPITAL_COMMUNITY)
Admission: EM | Admit: 2024-02-06 | Discharge: 2024-02-06 | Disposition: A | Discharge status: Home / Self Care | Payer: Self-pay | Attending: Family Medicine | Admitting: Family Medicine

## 2024-02-06 ENCOUNTER — Encounter (HOSPITAL_COMMUNITY): Payer: Self-pay

## 2024-02-06 DIAGNOSIS — S0502XA Injury of conjunctiva and corneal abrasion without foreign body, left eye, initial encounter: Secondary | ICD-10-CM

## 2024-02-06 MED ORDER — ERYTHROMYCIN 5 MG/GM OP OINT: TOPICAL_OINTMENT | OPHTHALMIC | 0 refills | Status: DC

## 2024-02-06 MED ORDER — TETRACAINE HCL 0.5 % OP SOLN
OPHTHALMIC | Status: AC
  Filled 2024-02-06: qty 4

## 2024-02-06 MED ORDER — FLUORESCEIN SODIUM 1 MG OP STRP
ORAL_STRIP | OPHTHALMIC | Status: AC
  Filled 2024-02-06: qty 1

## 2024-02-06 NOTE — ED Provider Notes (Addendum)
 [facility]   [id number] 02/06/24 Arrival Time: 1135  ASSESSMENT & PLAN:  1. Abrasion of left cornea, initial encounter    Begin: Meds ordered this encounter  Medications   erythromycin  ophthalmic ointment    Sig: Place a 1/2 inch ribbon of ointment into the left lower eyelid for the next 5-7 days.    Dispense:  3.5 g    Refill:  0   Ophthalmic ointment per orders. Local eye care discussed. Analgesics as needed. Work note provided.    Follow-up Information     Schedule an appointment as soon as possible for a visit  with Alvina Axon, MD.   Specialty: Ophthalmology Why: If worsening or failing to improve as anticipated. Contact information: [address] [place] [address] [phone]                  Reviewed expectations re: course of current medical issues. Questions answered. Outlined signs and symptoms indicating need for more acute intervention. Patient verbalized understanding. After Visit Summary given.   SUBJECTIVE:  Dale Evans is a 33 y.o. male who presents with complaint of LEFT eye discomfort. Noted this am upon waking. Mild light sensitivity. Denies eye trauma. No tx PTA. H/O corneal abrasion that felt similar. Does not wear contact lenses.  OBJECTIVE:  Vitals:   02/06/24 1258  BP: (!) 157/95  Pulse: 67  Resp: 16  Temp: 98.3 F (36.8 C)  TempSrc: Oral  SpO2: 97%    General appearance: alert; no distress HEENT: [place]; AT; PERRLA; no restriction of the extraocular movements OS: with mild discomfort reported; without conjunctival injection; without drainage; without corneal gross opacities; without limbal flush; without periorbital swelling or erythema; fluorescein  exam reveals pinpoint cornea uptake around 7 o'clock; no LOF OD: normal Skin: warm and dry Psychological: alert and cooperative; normal mood and affect    Visual Acuity  Right Eye Distance: 20/15 Left Eye Distance: 20/40 Bilateral  Distance: 20/15  Right Eye Near:   Left Eye Near:    Bilateral Near:     Allergies  Allergen Reactions   Licorice Flavor [Licorice Flavoring Agent (Non-Screening)] Anaphylaxis   Amoxicillin Nausea And Vomiting and Swelling    Childhood allergy. Has patient had a PCN reaction causing immediate rash, facial/tongue/throat swelling, SOB or lightheadedness with hypotension: No Has patient had a PCN reaction causing severe rash involving mucus membranes or skin necrosis: No Has patient had a PCN reaction that required hospitalization No Has patient had a PCN reaction occurring within the last 10 years: No If all of the above answers are "NO", then may proceed with Cephalosporin use.    Bee Venom Rash    Past Medical History:  Diagnosis Date   Bronchitis    Hypertension    Sleep apnea    hx of years ago per pt not now   Vertigo    Social History   Socioeconomic History   Marital status: Married    Spouse name: Not on file   Number of children: Not on file   Years of education: Not on file   Highest education level: Not on file  Occupational History   Not on file  Tobacco Use   Smoking status: Former    Types: Cigars   Smokeless tobacco: Never  Vaping Use   Vaping status: Never Used  Substance and Sexual Activity   Alcohol use: No   Drug use: Never    Comment: former user of CBD last used 12/11/19  Sexual activity: Not on file  Other Topics Concern   Not on file  Social History Narrative   ** Merged History Encounter **       Social Drivers of Health   Financial Resource Strain: Not on file  Food Insecurity: Not on file  Transportation Needs: Not on file  Physical Activity: Not on file  Stress: Not on file  Social Connections: Not on file  Intimate Partner Violence: Not on file   Family History  Problem Relation Age of Onset   Healthy Mother    Cancer Mother    Hypertension Mother    Healthy Father    Diabetes Father    Hypertension Maternal Grandmother     Stroke Maternal Grandmother    Past Surgical History:  Procedure Laterality Date   ABCESS DRAINAGE     Pilonidal cyst x5   KNEE SURGERY     PILONIDAL CYST EXCISION N/A 12/20/2022   Procedure: PILONIDAL CYSTECTOMY;  Surgeon: Dareen Ebbing, MD;  Location: [facility];  Service: General;  Laterality: N/A;   Roena Clark, MD 02/06/24 1429    Afton Albright, MD 02/06/24 1429

## 2024-02-06 NOTE — ED Triage Notes (Signed)
 Patient here today with c/o left eye pain upon waking this morning. Patient states that this has happened 4 times within the last year. It always happens when he wakes up.

## 2024-07-07 ENCOUNTER — Emergency Department (HOSPITAL_COMMUNITY): Payer: Self-pay

## 2024-07-07 ENCOUNTER — Encounter (HOSPITAL_COMMUNITY): Payer: Self-pay

## 2024-07-07 ENCOUNTER — Observation Stay (HOSPITAL_COMMUNITY): Payer: Self-pay

## 2024-07-07 ENCOUNTER — Other Ambulatory Visit: Payer: Self-pay

## 2024-07-07 ENCOUNTER — Observation Stay (HOSPITAL_COMMUNITY)
Admission: EM | Admit: 2024-07-07 | Discharge: 2024-07-08 | Disposition: A | Discharge status: Home / Self Care | Payer: Self-pay | Attending: Cardiovascular Disease | Admitting: Cardiovascular Disease

## 2024-07-07 DIAGNOSIS — E669 Obesity, unspecified: Secondary | ICD-10-CM | POA: Insufficient documentation

## 2024-07-07 DIAGNOSIS — E119 Type 2 diabetes mellitus without complications: Secondary | ICD-10-CM | POA: Insufficient documentation

## 2024-07-07 DIAGNOSIS — Z7984 Long term (current) use of oral hypoglycemic drugs: Secondary | ICD-10-CM | POA: Insufficient documentation

## 2024-07-07 DIAGNOSIS — Z1152 Encounter for screening for COVID-19: Secondary | ICD-10-CM | POA: Insufficient documentation

## 2024-07-07 DIAGNOSIS — Z6841 Body Mass Index (BMI) 40.0 and over, adult: Secondary | ICD-10-CM | POA: Insufficient documentation

## 2024-07-07 DIAGNOSIS — Z91148 Patient's other noncompliance with medication regimen for other reason: Secondary | ICD-10-CM

## 2024-07-07 DIAGNOSIS — R079 Chest pain, unspecified: Secondary | ICD-10-CM | POA: Insufficient documentation

## 2024-07-07 DIAGNOSIS — I5020 Unspecified systolic (congestive) heart failure: Secondary | ICD-10-CM | POA: Diagnosis present

## 2024-07-07 DIAGNOSIS — Z91199 Patient's noncompliance with other medical treatment and regimen due to unspecified reason: Secondary | ICD-10-CM | POA: Insufficient documentation

## 2024-07-07 DIAGNOSIS — R7989 Other specified abnormal findings of blood chemistry: Secondary | ICD-10-CM | POA: Insufficient documentation

## 2024-07-07 DIAGNOSIS — I5021 Acute systolic (congestive) heart failure: Secondary | ICD-10-CM

## 2024-07-07 DIAGNOSIS — Z87891 Personal history of nicotine dependence: Secondary | ICD-10-CM | POA: Insufficient documentation

## 2024-07-07 DIAGNOSIS — Z79899 Other long term (current) drug therapy: Secondary | ICD-10-CM | POA: Insufficient documentation

## 2024-07-07 DIAGNOSIS — R0609 Other forms of dyspnea: Secondary | ICD-10-CM

## 2024-07-07 DIAGNOSIS — E785 Hyperlipidemia, unspecified: Secondary | ICD-10-CM | POA: Insufficient documentation

## 2024-07-07 DIAGNOSIS — J069 Acute upper respiratory infection, unspecified: Principal | ICD-10-CM | POA: Insufficient documentation

## 2024-07-07 DIAGNOSIS — G4733 Obstructive sleep apnea (adult) (pediatric): Secondary | ICD-10-CM | POA: Insufficient documentation

## 2024-07-07 DIAGNOSIS — I110 Hypertensive heart disease with heart failure: Secondary | ICD-10-CM | POA: Insufficient documentation

## 2024-07-07 DIAGNOSIS — Z7982 Long term (current) use of aspirin: Secondary | ICD-10-CM | POA: Insufficient documentation

## 2024-07-07 DIAGNOSIS — I10 Essential (primary) hypertension: Secondary | ICD-10-CM | POA: Diagnosis present

## 2024-07-07 LAB — ECHOCARDIOGRAM COMPLETE
Area-P 1/2: 7.16 cm2
Calc EF: 33.4 %
Est EF: 30
Height: 75 in
S' Lateral: 5.1 cm
Single Plane A2C EF: 34.2 %
Single Plane A4C EF: 33.7 %
Weight: 5680 [oz_av]

## 2024-07-07 LAB — BASIC METABOLIC PANEL WITH GFR
Anion gap: 12 (ref 5–15)
BUN: 13 mg/dL (ref 6–20)
CO2: 21 mmol/L — ABNORMAL LOW (ref 22–32)
Calcium: 8.5 mg/dL — ABNORMAL LOW (ref 8.9–10.3)
Chloride: 104 mmol/L (ref 98–111)
Creatinine, Ser: 1.26 mg/dL — ABNORMAL HIGH (ref 0.61–1.24)
GFR, Estimated: >60 mL/min (ref >60)
Glucose, Bld: 189 mg/dL — ABNORMAL HIGH (ref 70–99)
Potassium: 3.6 mmol/L (ref 3.5–5.1)
Sodium: 137 mmol/L (ref 135–145)

## 2024-07-07 LAB — RESPIRATORY PANEL BY PCR

## 2024-07-07 LAB — TROPONIN I (HIGH SENSITIVITY)
Troponin I (High Sensitivity): 32 ng/L — ABNORMAL HIGH (ref <18)
Troponin I (High Sensitivity): 36 ng/L — ABNORMAL HIGH (ref <18)

## 2024-07-07 LAB — RESP PANEL BY RT-PCR (RSV, FLU A&B, COVID)  RVPGX2
Influenza A by PCR: NEGATIVE
Influenza B by PCR: NEGATIVE
Resp Syncytial Virus by PCR: NEGATIVE
SARS Coronavirus 2 by RT PCR: NEGATIVE

## 2024-07-07 LAB — BRAIN NATRIURETIC PEPTIDE: B Natriuretic Peptide: 137.7 pg/mL — ABNORMAL HIGH (ref 0.0–100.0)

## 2024-07-07 LAB — GROUP A STREP BY PCR: Group A Strep by PCR: NOT DETECTED

## 2024-07-07 LAB — RAPID URINE DRUG SCREEN, HOSP PERFORMED
Amphetamines: NOT DETECTED
Barbiturates: NOT DETECTED
Benzodiazepines: NOT DETECTED
Cocaine: NOT DETECTED
Opiates: NOT DETECTED
Tetrahydrocannabinol: NOT DETECTED

## 2024-07-07 LAB — CBC
HCT: 38.2 % — ABNORMAL LOW (ref 39.0–52.0)
Hemoglobin: 12.8 g/dL — ABNORMAL LOW (ref 13.0–17.0)
MCH: 28.4 pg (ref 26.0–34.0)
MCHC: 33.5 g/dL (ref 30.0–36.0)
MCV: 84.7 fL (ref 80.0–100.0)
Platelets: 215 K/uL (ref 150–400)
RBC: 4.51 MIL/uL (ref 4.22–5.81)
RDW: 13.2 % (ref 11.5–15.5)
WBC: 9.5 K/uL (ref 4.0–10.5)
nRBC: 0 % (ref 0.0–0.2)

## 2024-07-07 LAB — SEDIMENTATION RATE: Sed Rate: 13 mm/h (ref 0–16)

## 2024-07-07 LAB — D-DIMER, QUANTITATIVE: D-Dimer, Quant: 0.9 ug{FEU}/mL — ABNORMAL HIGH (ref 0.00–0.50)

## 2024-07-07 LAB — C-REACTIVE PROTEIN: CRP: 1.7 mg/dL — ABNORMAL HIGH (ref <1.0)

## 2024-07-07 MED ORDER — AMLODIPINE BESYLATE 5 MG PO TABS
10.0000 mg | ORAL_TABLET | Freq: Once | ORAL | Status: AC
  Administered 2024-07-07: 10 mg via ORAL
  Filled 2024-07-07: qty 2

## 2024-07-07 MED ORDER — SPIRONOLACTONE 25 MG PO TABS
25.0000 mg | ORAL_TABLET | Freq: Every day | ORAL | Status: DC
  Administered 2024-07-07 – 2024-07-08 (×2): 25 mg via ORAL
  Filled 2024-07-07: qty 1
  Filled 2024-07-07: qty 2

## 2024-07-07 MED ORDER — SACUBITRIL-VALSARTAN 24-26 MG PO TABS
1.0000 | ORAL_TABLET | Freq: Two times a day (BID) | ORAL | Status: DC
  Administered 2024-07-08: 1 via ORAL
  Filled 2024-07-07: qty 1

## 2024-07-07 MED ORDER — IOHEXOL 350 MG/ML SOLN
75.0000 mL | Freq: Once | INTRAVENOUS | Status: AC | PRN
Start: 2024-07-07 — End: 2024-07-07
  Administered 2024-07-07: 75 mL via INTRAVENOUS

## 2024-07-07 MED ORDER — ACETAMINOPHEN 325 MG PO TABS
650.0000 mg | ORAL_TABLET | ORAL | Status: DC | PRN
  Filled 2024-07-07: qty 2

## 2024-07-07 MED ORDER — ENOXAPARIN SODIUM 80 MG/0.8ML IJ SOSY
80.0000 mg | PREFILLED_SYRINGE | INTRAMUSCULAR | Status: DC
  Administered 2024-07-07: 80 mg via SUBCUTANEOUS
  Filled 2024-07-07 (×2): qty 0.8

## 2024-07-07 MED ORDER — ACETAMINOPHEN 325 MG PO TABS
650.0000 mg | ORAL_TABLET | Freq: Three times a day (TID) | ORAL | Status: DC
  Administered 2024-07-07 – 2024-07-08 (×4): 650 mg via ORAL
  Filled 2024-07-07 (×4): qty 2

## 2024-07-07 MED ORDER — BENZONATATE 100 MG PO CAPS
100.0000 mg | ORAL_CAPSULE | Freq: Three times a day (TID) | ORAL | Status: DC | PRN
  Administered 2024-07-08: 100 mg via ORAL
  Filled 2024-07-07: qty 1

## 2024-07-07 MED ORDER — MORPHINE SULFATE (PF) 4 MG/ML IV SOLN
4.0000 mg | Freq: Once | INTRAVENOUS | Status: AC
  Administered 2024-07-07: 4 mg via INTRAVENOUS
  Filled 2024-07-07: qty 1

## 2024-07-07 MED ORDER — SODIUM CHLORIDE 0.9 % IV SOLN: 250.0000 mL | INTRAVENOUS | Status: DC | PRN

## 2024-07-07 MED ORDER — SODIUM CHLORIDE 0.9% FLUSH
3.0000 mL | Freq: Two times a day (BID) | INTRAVENOUS | Status: DC
  Administered 2024-07-07 – 2024-07-08 (×2): 3 mL via INTRAVENOUS

## 2024-07-07 MED ORDER — GADOBUTROL 1 MMOL/ML IV SOLN
10.0000 mL | Freq: Once | INTRAVENOUS | Status: AC | PRN
  Administered 2024-07-07: 10 mL via INTRAVENOUS

## 2024-07-07 MED ORDER — SODIUM CHLORIDE 0.9% FLUSH: 3.0000 mL | INTRAVENOUS | Status: DC | PRN

## 2024-07-07 MED ORDER — FUROSEMIDE 10 MG/ML IJ SOLN
40.0000 mg | Freq: Once | INTRAMUSCULAR | Status: AC
  Administered 2024-07-07: 40 mg via INTRAVENOUS
  Filled 2024-07-07: qty 4

## 2024-07-07 MED ORDER — EMPAGLIFLOZIN 10 MG PO TABS
10.0000 mg | ORAL_TABLET | Freq: Every day | ORAL | Status: DC
  Administered 2024-07-08: 10 mg via ORAL
  Filled 2024-07-07: qty 1

## 2024-07-07 MED ORDER — LOSARTAN POTASSIUM 50 MG PO TABS
50.0000 mg | ORAL_TABLET | Freq: Every day | ORAL | Status: DC
Start: 2024-07-07 — End: 2024-07-07
  Administered 2024-07-07: 50 mg via ORAL
  Filled 2024-07-07: qty 1

## 2024-07-07 MED ORDER — CARVEDILOL 3.125 MG PO TABS
3.1250 mg | ORAL_TABLET | Freq: Two times a day (BID) | ORAL | Status: DC
  Administered 2024-07-08: 3.125 mg via ORAL
  Filled 2024-07-07: qty 1

## 2024-07-07 MED ORDER — ONDANSETRON HCL 4 MG/2ML IJ SOLN: 4.0000 mg | Freq: Four times a day (QID) | INTRAMUSCULAR | Status: DC | PRN

## 2024-07-07 MED ORDER — AMLODIPINE BESYLATE 10 MG PO TABS: 10.0000 mg | ORAL_TABLET | Freq: Every day | ORAL | 0 refills | Status: DC

## 2024-07-07 MED ORDER — PERFLUTREN LIPID MICROSPHERE
1.0000 mL | INTRAVENOUS | Status: AC | PRN
  Administered 2024-07-07: 2 mL via INTRAVENOUS

## 2024-07-07 MED ORDER — IPRATROPIUM-ALBUTEROL 0.5-2.5 (3) MG/3ML IN SOLN
3.0000 mL | Freq: Once | RESPIRATORY_TRACT | Status: AC
  Administered 2024-07-07: 3 mL via RESPIRATORY_TRACT
  Filled 2024-07-07: qty 3

## 2024-07-07 NOTE — ED Notes (Signed)
 Patient transported to MRI

## 2024-07-07 NOTE — ED Notes (Signed)
 Echo tech @ bedside.

## 2024-07-07 NOTE — ED Triage Notes (Signed)
 Pt arrived from home via POV c/o CP, SOB, and sore throat x 1 week that is gradually getting worse.

## 2024-07-07 NOTE — ED Notes (Signed)
 Dinner tray delivered but the patient is in MRI. Tray at [employer].

## 2024-07-07 NOTE — ED Notes (Signed)
 This [facility] about missing lovenox shot.

## 2024-07-07 NOTE — ED Provider Notes (Signed)
 [facility] Provider Note   CSN: [id number] Arrival date & time: 07/07/24  [id number]     Patient presents with: Chest Pain, Shortness of Breath, and Sore Throat   Dale Evans is a 33 y.o. male.   The history is provided by the patient and medical records.  Chest Pain Associated symptoms: shortness of breath   Shortness of Breath Associated symptoms: chest pain   Sore Throat Associated symptoms include chest pain and shortness of breath.   33 year old male with history of hypertension, presenting to the ED with cough, sore throat, SOB, and chest discomfort.  States symptoms ongoing for about 1 week now.  He works as a social research officer, government for [employer] and did some training classes over the weekend in [place].  States upon returning home he began feeling miserable with worsening symptoms.  Cough has been productive with some yellow/green phlegm.  Denies known sick contacts.  No fever/chills reported.  No prior cardiac hx.  Does have hx of HTN, has been off meds for several months.    Prior to Admission medications   Medication Sig Start Date End Date Taking? Authorizing Provider  amLODipine  (NORVASC ) 10 MG tablet Take 1 tablet (10 mg total) by mouth daily. 01/07/24   Rising, Asberry, PA-C  erythromycin  ophthalmic ointment Place a 1/2 inch ribbon of ointment into the left lower eyelid for the next 5-7 days. 02/06/24   Rolinda Rogue, MD    Allergies: Licorice flavor [licorice flavoring agent (non-screening)], Amoxicillin, and Bee venom    Review of Systems  Respiratory:  Positive for shortness of breath.   Cardiovascular:  Positive for chest pain.  All other systems reviewed and are negative.   Updated Vital Signs BP (!) 161/119 (BP Location: Right Arm)   Pulse (!) 102   Temp 99 F (37.2 C) (Oral)   Resp 16   Ht 6' 3 (1.905 m)   Wt (!) 161 kg   SpO2 96%   BMI 44.37 kg/m   Physical Exam Vitals and nursing note reviewed.   Constitutional:      Appearance: He is well-developed.  HENT:     Head: Normocephalic and atraumatic.  Eyes:     Conjunctiva/sclera: Conjunctivae normal.     Pupils: Pupils are equal, round, and reactive to light.  Cardiovascular:     Rate and Rhythm: Normal rate and regular rhythm.     Heart sounds: Normal heart sounds.  Pulmonary:     Effort: Pulmonary effort is normal.     Breath sounds: Wheezing present.     Comments: Faint expiratory wheezing and dry cough noted, NAD, able to speak in full sentences without difficulty Chest:     Comments: Reproducible chest pain, worse left sided, no bruising or deformities Abdominal:     General: Bowel sounds are normal.     Palpations: Abdomen is soft.  Musculoskeletal:        General: Normal range of motion.     Cervical back: Normal range of motion.  Skin:    General: Skin is warm and dry.  Neurological:     Mental Status: He is alert and oriented to person, place, and time.     (all labs ordered are listed, but only abnormal results are displayed) Labs Reviewed  BASIC METABOLIC PANEL WITH GFR - Abnormal; Notable for the following components:      Result Value   CO2 21 (*)    Glucose, Bld 189 (*)  Creatinine, Ser 1.26 (*)    Calcium 8.5 (*)    All other components within normal limits  CBC - Abnormal; Notable for the following components:   Hemoglobin 12.8 (*)    HCT 38.2 (*)    All other components within normal limits  BRAIN NATRIURETIC PEPTIDE - Abnormal; Notable for the following components:   B Natriuretic Peptide 137.7 (*)    All other components within normal limits  D-DIMER, QUANTITATIVE - Abnormal; Notable for the following components:   D-Dimer, Quant 0.90 (*)    All other components within normal limits  TROPONIN I (HIGH SENSITIVITY) - Abnormal; Notable for the following components:   Troponin I (High Sensitivity) 32 (*)    All other components within normal limits  TROPONIN I (HIGH SENSITIVITY) - Abnormal;  Notable for the following components:   Troponin I (High Sensitivity) 36 (*)    All other components within normal limits  GROUP A STREP BY PCR  RESP PANEL BY RT-PCR (RSV, FLU A&B, COVID)  [id number]    EKG: None  Radiology: DG Chest 2 View Result Date: 07/07/2024 EXAM: 2 VIEW(S) XRAY OF THE CHEST 07/07/2024 03:35:35 AM COMPARISON: 01/05/2020 CLINICAL HISTORY: chest pain. Chest Pain; Shortness of Breath; Sore Throat FINDINGS: LUNGS AND PLEURA: No focal pulmonary opacity. No frank interstitial edema. No pleural effusion. No pneumothorax. HEART AND MEDIASTINUM: The heart is top normal in size, unchanged. No acute abnormality of the mediastinal silhouette. BONES AND SOFT TISSUES: No acute osseous abnormality. IMPRESSION: 1. No acute cardiopulmonary process. Electronically signed by: Pinkie Pebbles MD 07/07/2024 03:38 AM EDT RP Workstation: [id number]     Procedures   Medications Ordered in the ED  ipratropium-albuterol (DUONEB) 0.5-2.5 (3) MG/3ML nebulizer solution 3 mL (3 mLs Nebulization Given 07/07/24 0504)  amLODipine  (NORVASC ) tablet 10 mg (10 mg Oral Given 07/07/24 0525)  morphine  (PF) 4 MG/ML injection 4 mg (4 mg Intravenous Given 07/07/24 0527)                                  Medical Decision Making Amount and/or Complexity of Data Reviewed Labs: ordered. Radiology: ordered and independent interpretation performed. ECG/medicine tests: ordered and independent interpretation performed.  Risk Prescription drug management.   33 year old male presenting to the ED with chest pain, shortness of breath, cough, sore throat for the past week.  Has been worse since the weekend.  He is afebrile and nontoxic in appearance here.  He is hypertensive, reports he has been off of his home amlodipine  for several months now.  He does have some faint wheezes and dry cough on exam but is in no acute respiratory distress with stable vitals on room air.  Chest pain notable with coughing and  palpation of left chest wall, no deformities.    EKG without any noted ischemia.  No findings of pericarditis.  Labs reassuring without leukocytosis or electrolyte derangement.  Troponin is 32, priors have all been normal.  Chest x-ray is clear.  Strep screen and RVP both negative.  Symptoms do seem very viral in nature.  Can consider troponin leak due to uncontrolled hypertension versus viral myocarditis picture vs other.  Will discuss with cardiology given no prior formal cardiac evaluations previously.  4:59 AM Spoke with cardiology, Dr. Dominica-- recommends trend trops, repeat EKG.  Requested to add on D-dimer and BNP.  Will give his home amlodipine  now for HTN control.  They will consult.  BNP 137.  D-dimer is 0.9.  Will get CTA.  Care will be signed out to oncoming provider to follow-up on this and cards recommendations.  Final diagnoses:  Upper respiratory tract infection, unspecified type  Chest pain, unspecified type  Hypertension, unspecified type  Non compliance w medication regimen    ED Discharge Orders     None          Jarold Olam HERO, PA-C 07/07/24 0634    Emil Share, DO 07/07/24 [phone]

## 2024-07-07 NOTE — ED Provider Notes (Signed)
 Cough, sore throat, no fever. Some productive cough. Workup fairly unremarkable. Has troponin 32, does have some fairly high blood pressure. Dimer positive so pending CTA. Cardiology will see him if nothing else on CT.   CT angio negative for PE.  Pending cardiology eval, and then suspect likely discharge.  After cardiology evaluation they will plan to admit for systolic heart failure.  Please see cardiology note for more detailed assessment and plan.   Rosan Sherlean VEAR DEVONNA 07/07/24 1155    Melvenia Motto, MD 07/07/24 [phone]

## 2024-07-07 NOTE — H&P (Addendum)
 Cardiology Admission History and Physical   Patient ID: Dale Evans MRN: [id number]; DOB: 26-May-1991   Admission date: 07/07/2024  PCP:  Freddrick No   [facility] Providers Cardiologist:  Darryle ONEIDA Decent, MD       Chief Complaint: Shortness of breath   Dale Evans is a 33 y.o. male with a hx of hypertension, and sleep apnea who is being seen 07/07/2024 for the evaluation of elevated troponins at the request of Rolan Quale DO.  History of Present Illness: Dale Evans is a 33 year old male who per chart review has not previously been seen by cardiology.  Patient presented to the emergency department complaining of chest pain and shortness of breath.  On interview patient reported has had worsening fatigue, dyspnea on exertion, and shortness of breath that started last Tuesday.  Also reported a productive cough, several episodes of chills, and several episodes of diaphoresis.  Last night at 1 AM had chest pain.  Had an episode of vomiting when his chest pain started.  The chest pain is sharp and worse when coughing and swallowing.  Denies that chest pain is worse with exertion.  Also reported having a pressure sensation when pressed on the chest.  Chest discomfort was better with morphine . Reported having an orthopnea and PND that started last night. Reported quitting using a CBD vape pen about 1 month ago.  Denies any tobacco use, alcohol use, illicit substance use.  Reports that he lives an active lifestyle and recently retired as a engineer, agricultural.  Labs showed elevated troponin 32 > 36, elevated BNP 137.7, elevated D-dimer of 0.90, slight anemia with hemoglobin of 12.8, WBC count of 9.5, potassium of 3.6, sodium of 137, elevated glucose of 189, elevated creatinine of 1.26, and hypocalcemia of 8.5.  Respiratory panel was negative.  Pulmonary CTA showed no evidence of CAD, no PE, bilateral ground glass opacities, and hepatic steatosis.  EKG showed sinus tachycardia with a rate of  108, right axis deviation, and inferior and lateral ST depression.  Blood pressures have been significantly elevated.  Most recent BP at 10 AM today was 150/106.  Past Medical History:  Diagnosis Date   Bronchitis    Hypertension    Sleep apnea    hx of years ago per pt not now   Vertigo    Past Surgical History:  Procedure Laterality Date   ABCESS DRAINAGE     Pilonidal cyst x5   KNEE SURGERY     PILONIDAL CYST EXCISION N/A 12/20/2022   Procedure: PILONIDAL CYSTECTOMY;  Surgeon: Belinda Cough, MD;  Location: [facility];  Service: General;  Laterality: N/A;   TONSILLECTOMY       Medications Prior to Admission: Prior to Admission medications   Medication Sig Start Date End Date Taking? Authorizing Provider  amLODipine  (NORVASC ) 10 MG tablet Take 1 tablet (10 mg total) by mouth daily. 07/07/24  Yes Jarold Olam HERO, PA-C     Allergies:    Allergies  Allergen Reactions   Licorice Flavor [Licorice Flavoring Agent (Non-Screening)] Anaphylaxis   Amoxicillin Nausea And Vomiting and Swelling    Childhood allergy. Has patient had a PCN reaction causing immediate rash, facial/tongue/throat swelling, SOB or lightheadedness with hypotension: No Has patient had a PCN reaction causing severe rash involving mucus membranes or skin necrosis: No Has patient had a PCN reaction that required hospitalization No Has patient had a PCN reaction occurring within the last 10 years: No If all of the above answers are NO, then  may proceed with Cephalosporin use.    Bee Venom Rash    Social History:   Social History   Socioeconomic History   Marital status: Married    Spouse name: Not on file   Number of children: Not on file   Years of education: Not on file   Highest education level: Not on file  Occupational History   Not on file  Tobacco Use   Smoking status: Former    Types: Cigars   Smokeless tobacco: Never  Vaping Use   Vaping status: Never Used  Substance and Sexual Activity    Alcohol use: No   Drug use: Never    Comment: former user of CBD last used 12/11/19   Sexual activity: Not on file  Other Topics Concern   Not on file  Social History Narrative   ** Merged History Encounter **       Social Drivers of Corporate Investment Banker Strain: Not on file  Food Insecurity: Not on file  Transportation Needs: Not on file  Physical Activity: Not on file  Stress: Not on file  Social Connections: Not on file  Intimate Partner Violence: Not on file     Family History:   The patient's family history includes Cancer in his mother; Diabetes in his father; Healthy in his father and mother; Hypertension in his maternal grandmother and mother; Stroke in his maternal grandmother.    ROS:  Please see the history of present illness.  All other ROS reviewed and negative.     Physical Exam/Data: Vitals:   07/07/24 1030 07/07/24 1045 07/07/24 1100 07/07/24 1115  BP: (!) 143/102 (!) 138/93 (!) 151/73 (!) 146/89  Pulse: (!) 101 93 95 94  Resp: 20 (!) 32 16 18  Temp:      TempSrc:      SpO2: 100% 100% 100% 100%  Weight:      Height:       No intake or output data in the 24 hours ending 07/07/24 1153    07/07/2024    2:57 AM 07/22/2023   11:00 PM 12/20/2022   11:01 AM  Last 3 Weights  Weight (lbs) 355 lb 365 lb 343 lb 14.7 oz  Weight (kg) 161.027 kg 165.563 kg 156 kg     Body mass index is 44.37 kg/m.    General:  Well nourished, well developed, in no acute distress.  Alert and orientated on room air HEENT: normal Neck: Difficulty assessing JVD due to body habitus but appears negative. Vascular: No carotid bruits; Distal pulses 2+ bilaterally Cardiac:  normal S1, S2; RRR; no murmur.  Chest tender to palpation. Lungs:  clear to auscultation bilaterally, no wheezing, rhonchi or rales  Abd: soft, nontender.  Ext: no edema Musculoskeletal:  No deformities. Skin: warm and dry  Neuro:  no focal abnormalities noted Psych:  Normal affect   EKG:  The EKG was  personally reviewed and demonstrates:  showed sinus tachycardia with a rate of 108, LVH, right axis deviation, and inferior and lateral ST depression. Telemetry:  Telemetry was personally reviewed and demonstrates: Normal sinus rhythm with heart rates in the 90s to 100s.  Relevant CV Studies: Echo pending   Laboratory Data: High Sensitivity Troponin:   Recent Labs  Lab 07/07/24 0300 07/07/24 0503  TROPONINIHS 32* 36*      Chemistry Recent Labs  Lab 07/07/24 0300  NA 137  K 3.6  CL 104  CO2 21*  GLUCOSE 189*  BUN 13  CREATININE 1.26*  CALCIUM 8.5*  GFRNONAA >60  ANIONGAP 12    No results for input(s): PROT, ALBUMIN, AST, ALT, ALKPHOS, BILITOT in the last 168 hours. Lipids No results for input(s): CHOL, TRIG, HDL, LABVLDL, LDLCALC, CHOLHDL in the last 168 hours. Hematology Recent Labs  Lab 07/07/24 0300  WBC 9.5  RBC 4.51  HGB 12.8*  HCT 38.2*  MCV 84.7  MCH 28.4  MCHC 33.5  RDW 13.2  PLT 215   Thyroid No results for input(s): TSH, FREET4 in the last 168 hours. BNP Recent Labs  Lab 07/07/24 0515  BNP 137.7*    DDimer  Recent Labs  Lab 07/07/24 0515  DDIMER 0.90*    Radiology/Studies:  CT Angio Chest PE W and/or Wo Contrast Result Date: 07/07/2024 EXAM: CTA of the Chest with contrast for PE 07/07/2024 06:41:50 AM TECHNIQUE: CTA of the chest was performed without and with the administration of 75 mL iohexol  (OMNIPAQUE ) 350 MG/ML injection. Multiplanar reformatted images are provided for review. MIP images are provided for review. Automated exposure control, iterative reconstruction, and/or weight based adjustment of the mA/kV was utilized to reduce the radiation dose to as low as reasonably achievable. COMPARISON: Chest radiographs earlier today, CTA chest 03/20/2019. CLINICAL HISTORY: 33 year old male. PE suspected, low to intermediate prob, positive D-dimer. c/o CP, SOB, and sore throat x 1 week that is gradually getting worse.  FINDINGS: PULMONARY ARTERIES: Pulmonary arteries are adequately opacified for evaluation. Mild respiratory motion in both lower lungs. No pulmonary embolism. Main pulmonary artery is normal in caliber. MEDIASTINUM: The heart demonstrates no acute abnormality. Heart size is stable. No pericardial effusion. Suboptimal contrast opacification of the thoracic aorta, but no acute abnormality is visible. No evidence of calcified coronary artery atherosclerosis. LYMPH NODES: No mediastinal, hilar or axillary lymphadenopathy. LUNGS AND PLEURA: Low lung volumes, lower compared to the prior CTA. Indistinct bilateral perihilar pulmonary ground glass opacity could be atelectasis, but acute bilateral viral / atypical respiratory infection is not excluded. No focal consolidation or pulmonary edema. No pleural effusion or pneumothorax. UPPER ABDOMEN: Hepatic steatosis which is new or increased from previous CTA. Negative visible other mostly non-contrast upper abdominal viscera. SOFT TISSUES AND BONES: Chronic age advanced thoracic posterior element degeneration at T5-T6 and T6-T7. Bulky and partially calcified ligamentum flavum hypertrophy at those levels. No acute soft tissue abnormality. No acute bone abnormality. IMPRESSION: 1. Mild motion artifact. No pulmonary embolism identified. 2. Low lung volumes with bilateral ground glass opacities, may be atelectasis but acute atypical/viral respiratory infection no excluded. 3. Hepatic steatosis. Electronically signed by: Helayne Hurst MD 07/07/2024 06:48 AM EDT RP Workstation: [id number]   DG Chest 2 View Result Date: 07/07/2024 EXAM: 2 VIEW(S) XRAY OF THE CHEST 07/07/2024 03:35:35 AM COMPARISON: 01/05/2020 CLINICAL HISTORY: chest pain. Chest Pain; Shortness of Breath; Sore Throat FINDINGS: LUNGS AND PLEURA: No focal pulmonary opacity. No frank interstitial edema. No pleural effusion. No pneumothorax. HEART AND MEDIASTINUM: The heart is top normal in size, unchanged. No acute  abnormality of the mediastinal silhouette. BONES AND SOFT TISSUES: No acute osseous abnormality. IMPRESSION: 1. No acute cardiopulmonary process. Electronically signed by: Pinkie Pebbles MD 07/07/2024 03:38 AM EDT RP Workstation: [id number]     Assessment and Plan: Dale Evans is a 33 y.o. male with a hx of hypertension, and sleep apnea who is being seen 07/07/2024 for the evaluation of elevated troponins at the request of Rolan Quale DO.  Patient was admitted to cardiology for inpatient management of his heart failure.  Systolic heart failure Elevated BNP 137.7 Reported having worsening dyspnea on exertion, an orthopnea and PND last night. Pulmonary CTA showed bilateral ground glass opacities, and hepatic steatosis. Appears euvolemic on exam but is difficult to assess volume status given patient's body habitus.  Dr. Barbaraann did a bedside echo and reported that the patient had a reduced LVEF. Order 40mg  IV lasix. Echo pending Suspect will need cardiac MRI tomorrow GDMT Start losartan 50mg  daily   Chest pain LVH Last night at 1 AM had chest pain and vomiting.  Chest pain is sharp and worse with coughing and swallowing.  Denies that the chest pain is worse with exertion.  This discomfort was better with morphine .  Has chest pressure when pressed.  This is atypical for ACS.  Suspect that pain is either pleuritic, musculoskeletal, or secondary to the patient's heart failure. Pulmonary CTA showed no evidence of CAD and no PE Elevated high sensitivity troponin 32 > 36. Suspect is secondary to elevated BP The EKG was personally reviewed and demonstrates:  showed sinus tachycardia with a rate of 108, LVH, and inferior and lateral ST depression. EKG changes are likely due to LVH. Ordered CRP or ESR. Echo pending.   Hypertension Blood pressures have been elevated.  Most recent BP at 10 AM today was 150/106. Is poorly managed. Continue amlodipine  10 mg daily. Start losartan 50mg   daily.   Obesity OSA Reported having OSA when tested as a child. Suspect will need another sleep study.  May consider Inpatient CPAP   Risk Assessment/Risk Scores:     [place]  Heart Association (NYHA) Functional Class NYHA Class II   Code Status: Full Code  Severity of Illness: The appropriate patient status for this patient is OBSERVATION. Observation status is judged to be reasonable and necessary in order to provide the required intensity of service to ensure the patient's safety. The patient's presenting symptoms, physical exam findings, and initial radiographic and laboratory data in the context of their medical condition is felt to place them at decreased risk for further clinical deterioration. Furthermore, it is anticipated that the patient will be medically stable for discharge from the hospital within 2 midnights of admission.   For questions or updates, please contact [facility] Please consult [email or web address] for contact info under      Signed, Morse Clause, PA-C  07/07/2024 11:53 AM       Risk Assessment/Risk Scores:     [place]  Heart Association (NYHA) Functional Class NYHA Class II     For questions or updates, please contact [facility] Please consult [email or web address] for contact info under     Signed, Morse Clause, PA-C  07/07/2024 11:53 AM

## 2024-07-08 ENCOUNTER — Other Ambulatory Visit (HOSPITAL_COMMUNITY): Payer: Self-pay

## 2024-07-08 ENCOUNTER — Encounter (HOSPITAL_COMMUNITY): Payer: Self-pay | Admitting: Cardiovascular Disease

## 2024-07-08 LAB — BASIC METABOLIC PANEL WITH GFR
Anion gap: 13 (ref 5–15)
BUN: 16 mg/dL (ref 6–20)
CO2: 25 mmol/L (ref 22–32)
Calcium: 8.6 mg/dL — ABNORMAL LOW (ref 8.9–10.3)
Chloride: 97 mmol/L — ABNORMAL LOW (ref 98–111)
Creatinine, Ser: 1.24 mg/dL (ref 0.61–1.24)
GFR, Estimated: >60 mL/min (ref >60)
Glucose, Bld: 201 mg/dL — ABNORMAL HIGH (ref 70–99)
Potassium: 3.4 mmol/L — ABNORMAL LOW (ref 3.5–5.1)
Sodium: 135 mmol/L (ref 135–145)

## 2024-07-08 LAB — LIPID PANEL
Cholesterol: 209 mg/dL — ABNORMAL HIGH (ref 0–200)
HDL: 27 mg/dL — ABNORMAL LOW (ref >40)
LDL Cholesterol: 123 mg/dL — ABNORMAL HIGH (ref 0–99)
Total CHOL/HDL Ratio: 7.7 ratio
Triglycerides: 293 mg/dL — ABNORMAL HIGH (ref <150)
VLDL: 59 mg/dL — ABNORMAL HIGH (ref 0–40)

## 2024-07-08 LAB — CBC
HCT: 36.9 % — ABNORMAL LOW (ref 39.0–52.0)
Hemoglobin: 12.6 g/dL — ABNORMAL LOW (ref 13.0–17.0)
MCH: 28.5 pg (ref 26.0–34.0)
MCHC: 34.1 g/dL (ref 30.0–36.0)
MCV: 83.5 fL (ref 80.0–100.0)
Platelets: 201 K/uL (ref 150–400)
RBC: 4.42 MIL/uL (ref 4.22–5.81)
RDW: 13.2 % (ref 11.5–15.5)
WBC: 10.8 K/uL — ABNORMAL HIGH (ref 4.0–10.5)
nRBC: 0 % (ref 0.0–0.2)

## 2024-07-08 LAB — TSH: TSH: 1.229 u[IU]/mL (ref 0.350–4.500)

## 2024-07-08 LAB — GLUCOSE, CAPILLARY: Glucose-Capillary: 195 mg/dL — ABNORMAL HIGH (ref 70–99)

## 2024-07-08 LAB — HEMOGLOBIN A1C
Hgb A1c MFr Bld: 7.7 % — ABNORMAL HIGH (ref 4.8–5.6)
Mean Plasma Glucose: 174.29 mg/dL

## 2024-07-08 LAB — MAGNESIUM: Magnesium: 2 mg/dL (ref 1.7–2.4)

## 2024-07-08 LAB — HIV ANTIBODY (ROUTINE TESTING W REFLEX): HIV Screen 4th Generation wRfx: NONREACTIVE

## 2024-07-08 MED ORDER — FLUTICASONE PROPIONATE 50 MCG/ACT NA SUSP
2.0000 | Freq: Every day | NASAL | Status: DC | PRN
  Administered 2024-07-08: 2 via NASAL
  Filled 2024-07-08: qty 16

## 2024-07-08 MED ORDER — LORATADINE 10 MG PO TABS
10.0000 mg | ORAL_TABLET | Freq: Every day | ORAL | Status: DC | PRN
  Administered 2024-07-08: 10 mg via ORAL
  Filled 2024-07-08: qty 1

## 2024-07-08 MED ORDER — IRBESARTAN 300 MG PO TABS
150.0000 mg | ORAL_TABLET | Freq: Every day | ORAL | Status: DC
  Administered 2024-07-08: 150 mg via ORAL
  Filled 2024-07-08: qty 1

## 2024-07-08 MED ORDER — EMPAGLIFLOZIN 10 MG PO TABS
10.0000 mg | ORAL_TABLET | Freq: Every day | ORAL | 6 refills | Status: DC
  Filled 2024-07-08: qty 30, 30d supply, fill #0

## 2024-07-08 MED ORDER — ACETAMINOPHEN 325 MG PO TABS
650.0000 mg | ORAL_TABLET | Freq: Three times a day (TID) | ORAL | 0 refills | Status: DC
  Filled 2024-07-08: qty 42, 7d supply, fill #0

## 2024-07-08 MED ORDER — IRBESARTAN 150 MG PO TABS: 150.0000 mg | ORAL_TABLET | Freq: Every day | ORAL | 3 refills | Status: DC

## 2024-07-08 MED ORDER — METFORMIN HCL 500 MG PO TABS
500.0000 mg | ORAL_TABLET | Freq: Two times a day (BID) | ORAL | 6 refills | Status: AC
  Filled 2024-07-08: qty 60, 30d supply, fill #0
  Filled 2024-08-04: qty 60, 30d supply, fill #1
  Filled 2024-09-03: qty 60, 30d supply, fill #2
  Filled 2024-10-01: qty 60, 30d supply, fill #0

## 2024-07-08 MED ORDER — ACETAMINOPHEN 325 MG PO TABS: 650.0000 mg | ORAL_TABLET | Freq: Three times a day (TID) | ORAL | 0 refills | Status: DC

## 2024-07-08 MED ORDER — INSULIN ASPART 100 UNIT/ML IJ SOLN: 0.0000 [IU] | Freq: Every day | INTRAMUSCULAR | Status: DC

## 2024-07-08 MED ORDER — EMPAGLIFLOZIN 10 MG PO TABS: 10.0000 mg | ORAL_TABLET | Freq: Every day | ORAL | 6 refills | Status: DC

## 2024-07-08 MED ORDER — SPIRONOLACTONE 25 MG PO TABS: 25.0000 mg | ORAL_TABLET | Freq: Every day | ORAL | 6 refills | Status: DC

## 2024-07-08 MED ORDER — POTASSIUM CHLORIDE CRYS ER 20 MEQ PO TBCR
40.0000 meq | EXTENDED_RELEASE_TABLET | Freq: Once | ORAL | Status: AC
  Administered 2024-07-08: 40 meq via ORAL
  Filled 2024-07-08: qty 2

## 2024-07-08 MED ORDER — CARVEDILOL 3.125 MG PO TABS
3.1250 mg | ORAL_TABLET | Freq: Two times a day (BID) | ORAL | 3 refills | Status: DC
  Filled 2024-07-08: qty 60, 30d supply, fill #0

## 2024-07-08 MED ORDER — METFORMIN HCL 500 MG PO TABS
500.0000 mg | ORAL_TABLET | Freq: Two times a day (BID) | ORAL | 6 refills | Status: DC
  Filled 2024-07-08: qty 30, 15d supply, fill #0

## 2024-07-08 MED ORDER — CARVEDILOL 3.125 MG PO TABS: 3.1250 mg | ORAL_TABLET | Freq: Two times a day (BID) | ORAL | 3 refills | Status: DC

## 2024-07-08 MED ORDER — ATORVASTATIN CALCIUM 40 MG PO TABS
40.0000 mg | ORAL_TABLET | Freq: Every day | ORAL | Status: DC
Start: 2024-07-08 — End: 2024-07-08
  Administered 2024-07-08: 40 mg via ORAL
  Filled 2024-07-08: qty 1

## 2024-07-08 MED ORDER — ATORVASTATIN CALCIUM 40 MG PO TABS: 40.0000 mg | ORAL_TABLET | Freq: Every day | ORAL | 6 refills | Status: DC

## 2024-07-08 MED ORDER — METFORMIN HCL 500 MG PO TABS: 500.0000 mg | ORAL_TABLET | Freq: Two times a day (BID) | ORAL | Status: DC

## 2024-07-08 MED ORDER — IRBESARTAN 150 MG PO TABS
150.0000 mg | ORAL_TABLET | Freq: Every day | ORAL | 3 refills | Status: DC
  Filled 2024-07-08: qty 30, 30d supply, fill #0

## 2024-07-08 MED ORDER — METFORMIN HCL 500 MG PO TABS: 500.0000 mg | ORAL_TABLET | Freq: Two times a day (BID) | ORAL | 6 refills | Status: DC

## 2024-07-08 MED ORDER — SPIRONOLACTONE 25 MG PO TABS
25.0000 mg | ORAL_TABLET | Freq: Every day | ORAL | 6 refills | Status: DC
  Filled 2024-07-08: qty 30, 30d supply, fill #0

## 2024-07-08 MED ORDER — ATORVASTATIN CALCIUM 40 MG PO TABS
40.0000 mg | ORAL_TABLET | Freq: Every day | ORAL | 6 refills | Status: DC
  Filled 2024-07-08: qty 30, 30d supply, fill #0

## 2024-07-08 MED ORDER — CARVEDILOL 3.125 MG PO TABS
3.1250 mg | ORAL_TABLET | Freq: Two times a day (BID) | ORAL | 3 refills | Status: DC
  Filled 2024-07-08: qty 30, 15d supply, fill #0

## 2024-07-08 MED ORDER — INSULIN ASPART 100 UNIT/ML IJ SOLN
0.0000 [IU] | Freq: Three times a day (TID) | INTRAMUSCULAR | Status: DC
  Administered 2024-07-08: 3 [IU] via SUBCUTANEOUS

## 2024-07-08 NOTE — Discharge Summary (Signed)
 Discharge Summary   Patient ID: Dale Evans MRN: [id number]; DOB: [birth year]  Admit date: 07/07/2024 Discharge date: 07/08/2024  PCP:  Freddrick No    [facility] Providers Cardiologist:  Darryle ONEIDA Decent, MD      Discharge Diagnoses  Principal Problem:   Acute systolic heart failure [facility]) Active Problems:   Hypertension   Chest pain   Upper respiratory tract infection   Systolic heart failure (HCC)   Diagnostic Studies/Procedures   TTE 07/07/2024  IMPRESSIONS     1. Left ventricular ejection fraction, by estimation, is 30%. The left  ventricle has severely decreased function. The left ventricle demonstrates  global hypokinesis. The left ventricular internal cavity size was  moderately to severely dilated. There is  mild concentric left ventricular hypertrophy. Left ventricular diastolic  parameters are consistent with Grade I diastolic dysfunction (impaired  relaxation).   2. Right ventricular systolic function is normal. The right ventricular  size is normal. Tricuspid regurgitation signal is inadequate for assessing  PA pressure.   3. Left atrial size was moderately dilated.   4. The mitral valve is normal in structure. Trivial mitral valve  regurgitation. No evidence of mitral stenosis.   5. The aortic valve is tricuspid. Aortic valve regurgitation is not  visualized. No aortic stenosis is present.   6. IVC not well visualized, but does not appear to be dilated in limited  views.   Cardiac MRI 07/07/2024  IMPRESSION: 1. The left ventricle is severely dilated with mild concentric hypertrophy. Severely reduced left ventricular systolic function, LVEF 24%. Global hypokinesis.   2. The right ventricle is mildly dilated with normal systolic function.   3. No significant valvular heart disease.   4. Faint mid wall LGE is present in the basal septum which is a nonspecific finding in a non-ischemic cardiomyopathy. Normal T1/T2 values. No evidence of  infarction, inflammation, or infiltrative disorder.   Darryle Decent, MD _____________   History of Present Illness   Dale Evans is a 33 y.o. male with a past medical history of hypertension and sleep apnea. Presented to the emergency department for chest pain, dyspnea on exertion, shortness of breath, orthopnea, and PND.  His chest pain was atypical for ACS.  He had an elevated BNP of 137.7.  A pulmonary CTA was done and showed no evidence of PE but found bilateral ground glass opacities.  Dr. Vernice did a bedside echo and showed the patient had a reduced LVEF.  Because of this the patient was admitted to cardiology service.   Hospital Course   Consultants:  None  New onset systolic heart failure Hypokalemia Mild concentric LVH Was admitted to cardiology service as mentioned above.  The patient was felt to be euvolemic on exam.  He was given 1 dose of 40 mg IV Lasix, started on Aldactone 25 mg daily, and started on 50 mg losartan.  Multiple labs were ordered including a UDS that was negative, hemoglobin A1c was elevated at 7.7, TSH was within normal limits, CRP was elevated at 1.7, ESR within normal limits, and HIV was nonreactive TTE was done  yesterday and it was a difficult study.  It showed a  reduced LVEF of 30%, global hypokinesis, moderate to severely dilated LV, mild concentric LVH, G1 DD, moderately dilated left atrium, and grossly normal valve function.  A cardiac MRI was also done yesterday and showed a severely dilated LV with mild LVH, LVEF of 24%, RV with normal systolic function, faint mid wall LGE which is  a nonspecific finding for nonischemic cardiomyopathy.  It found no evidence of infarction, inflammation or infiltrative disorder.  On 07/08/2024 labs showed hypokalemia with a potassium of 3.4.  Potassium replacement was ordered.  Patient was transition from losartan to Entresto.  Will transition to irbesartan 150 mg daily due to cost concerns with Entresto. The patient was  seen by Dr. Barbaraann and felt to be stable for discharge. GDMT Continue Jardiance 10 mg daily. Continue Aldactone 25 mg daily. Continue Coreg 3.125 mg twice daily. Start Irbesartan 150 mg daily. Follow-up with Dr. Barbaraann on 07/25/2024   Chest pain Cough Patient's chest pain was atypical for ACS. A 20 pathogen respiratory panel was negative.  The chest pain is likely musculoskeletal or provoked by the patient's heart failure.  He was started on Tylenol  and Tessalon  Perles. Continue Tylenol  650 mg 3 times daily for 7 days   Hypertension Prior to admission was on amlodipine  10 mg daily.  home amlodipine  was stopped and GDMT was prioritized.  Blood pressures have been elevated this hospitalization. EKG showed sinus tachycardia with a rate of 108 and changes likely secondary to LVH. Continue amlodipine  10mg  daily Management per GDMT above. Recommend checking blood pressures daily and keeping a log to bring to follow up appointment.    Type 2 diabetes Obesity BMI of 45 Hyperlipidemia On physical exam had acanthosis nigricans.  Labs showed elevated hemoglobin A1c of 7.7.  Because of this the patient was started on sliding scale insulin. A lipid panel was ordered and showed an elevated LDL of 123. Start Jardiance as above Start metformin 500 mg twice daily Start Lipitor 40 mg daily due to diabetes and multiple risk factors for ASCVD Needs to establish care with a PCP. Contacted case management.   Elevated high-sensitivity troponins 32 > 36 Patient's chest pain was atypical for ACS. No evidence of coronary calcifications or aortic atherosclerosis was seen on CT angiography. High-sensitivity troponins are likely secondary to demand ischemia from new onset systolic heart failure.       Did the patient have an acute coronary syndrome (MI, NSTEMI, STEMI, etc) this admission?:  No                               Did the patient have a percutaneous coronary intervention (stent / angioplasty)?:   No.     The patient has a follow-up appointment scheduled with Dr. Barbaraann on 07/25/2024 at 1 PM.  Has new patient appointment to establish care with PCP on 08/27/2024. _____________  Discharge Vitals Blood pressure (!) 179/114, pulse 91, temperature 97.8 F (36.6 C), temperature source Oral, resp. rate 19, height 6' 3 (1.905 m), weight (!) 165.5 kg, SpO2 94%.  Filed Weights   07/07/24 0257 07/07/24 2245 07/08/24 0105  Weight: (!) 161 kg (!) 165.5 kg (!) 165.5 kg    Labs & Radiologic Studies  CBC Recent Labs    07/07/24 0300 07/08/24 0240  WBC 9.5 10.8*  HGB 12.8* 12.6*  HCT 38.2* 36.9*  MCV 84.7 83.5  PLT 215 201   Basic Metabolic Panel Recent Labs    89/72/74 0300 07/08/24 0240  NA 137 135  K 3.6 3.4*  CL 104 97*  CO2 21* 25  GLUCOSE 189* 201*  BUN 13 16  CREATININE 1.26* 1.24  CALCIUM 8.5* 8.6*  MG  --  2.0   Liver Function Tests No results for input(s): AST, ALT, ALKPHOS, BILITOT, PROT, ALBUMIN in the  last 72 hours. No results for input(s): LIPASE, AMYLASE in the last 72 hours. High Sensitivity Troponin:   Recent Labs  Lab 07/07/24 0300 07/07/24 0503  TROPONINIHS 32* 36*    No results for input(s): TRNPT in the last 720 hours.  BNP Invalid input(s): POCBNP No results for input(s): PROBNP in the last 72 hours.  Recent Labs    07/07/24 0515  BNP 137.7*    D-Dimer Recent Labs    07/07/24 0515  DDIMER 0.90*   Hemoglobin A1C Recent Labs    07/08/24 0240  HGBA1C 7.7*   Fasting Lipid Panel Recent Labs    07/08/24 0240  CHOL 209*  HDL 27*  LDLCALC 123*  TRIG 293*  CHOLHDL 7.7   No results found for: LIPOA  Thyroid Function Tests Recent Labs    07/08/24 0240  TSH 1.229   _____________  MR CARDIAC MORPHOLOGY W WO CONTRAST Result Date: 07/08/2024 CLINICAL DATA:  Cardiomyopathy suspected EXAM: MR CARDIA MORPHOLOGY WITHOUT AND WITH CONTRAST; MR CARDIAC VELOCITY FLOW MAPPING TECHNIQUE: The patient was scanned  on a 1.5 Tesla Siemens magnet. A dedicated cardiac coil was used. Functional imaging was done using TrueFisp sequences. 2,3, and 4 chamber views were done to assess for RWMA's. Modified Simpson's rule using a short axis stack was used to calculate an ejection fraction on a dedicated work [employer]. The patient received 10mL GADAVIST GADOBUTROL 1 MMOL/ML IV SOLN. After 10 minutes inversion recovery sequences were used to assess for infiltration and scar tissue. Phase contrast velocity encoded images obtained x 2. This examination is tailored for evaluation cardiac anatomy and function and provides very limited assessment of noncardiac structures, which are accordingly not evaluated during interpretation. If there is clinical concern for extracardiac pathology, further evaluation with CT imaging should be considered. FINDINGS: LEFT VENTRICLE: Severely dilated left ventricular cavity. Maximum septal wall thickness: 1.3 cm. Posterior wall thickness 1.1 cm. Left ventricular internal diameter (diastole): 6.8 cm. There are no regional wall motion abnormalities. Global hypokinesis. LV EF: 24% (Normal 49-79%) Absolute volumes: LV EDV: 339 mL (Normal 95-215 mL) LV ESV: 256 mL (Normal 25-85 mL) LV SV: 83 mL (Normal 61-145 mL) CO: 7.7 L/min (Normal 3.4-7.8 L/min) Indexed volumes: LV EDV: 116 mL/sq-m (Normal 50-108 mL/sq-m) LV ESV: 88 mL/sq-m (Normal 11-47 mL/sq-m) LV SV: 28 mL/sq-m (Normal 33-72 mL/sq-m) CI: 2.6 L/min/sq-m (Normal 1.8-4.2 L/min/sq-m) RIGHT VENTRICLE: Normal right ventricular chamber size. Normal right ventricular wall thickness. Normal right ventricular systolic function. There are no regional wall motion abnormalities. RV EF:  53% (Normal 51-80%) Absolute volumes: RV EDV: 218 mL (Normal 109-217 mL) RV ESV: 102 mL (Normal 23-91 mL) RV SV: 116 mL (Normal 71-141 mL) CO: 10.9 L/min (Normal 2.8-8.8 L/min) Indexed volumes: RV EDV: 75 mL/sq-m (Normal 58-109 mL/sq-m) RV ESV: 35 mL/sq-m (Normal  12-46 mL/sq-m) RV SV: 40 mL/sq-m (Normal 38-71 mL/sq-m) CI: 3.7 L/min/sq-m (Normal 1.7-4.2 L/min/sq-m) Left atrium: Normal size. Right atrium: Normal size. Mitral valve: Normal mitral valve without regurgitation. Aortic valve: Tricuspid aortic valve without regurgitation. Tricuspid valve: Normal tricuspid valve without regurgitation. Pulmonic valve: Normal pulmonary valve with trivial regurgitation. Aorta: The aortic root and proximal ascending aorta are normal in diameter. Pulmonary artery: Dilated suggestive of pulmonary hypertension. Pericardium: The pericardium is normal thickness. There is no pericardial effusion. There is no evidence of intracardiac thrombus. Systemic flow across the aortic valve (QS) was 7.1 L/min. Pulmonary flow across the pulmonic valve (QP) was 7.1 L/min. The QP:QS calculated across the aortic and pulmonic valves  was 0.99. Native myocardial T1-relaxation times were normal at 1030 ms. Native myocardial T2-relaxation times were normal at 47 ms. Delayed Enhancement: Faint mid wall LGE is present in the basal septum which is a nonspecific finding in a non-ischemic cardiomyopathy. No evidence of infarction, inflammation, or infiltrative disorder. Extracardiac structures: No significant findings IMPRESSION: 1. The left ventricle is severely dilated with mild concentric hypertrophy. Severely reduced left ventricular systolic function, LVEF 24%. Global hypokinesis. 2. The right ventricle is mildly dilated with normal systolic function. 3. No significant valvular heart disease. 4. Faint mid wall LGE is present in the basal septum which is a nonspecific finding in a non-ischemic cardiomyopathy. Normal T1/T2 values. No evidence of infarction, inflammation, or infiltrative disorder. Darryle Decent, MD Electronically Signed   By: Darryle Decent M.D.   On: 07/08/2024 10:53   MR CARDIAC VELOCITY FLOW MAP Result Date: 07/08/2024 CLINICAL DATA:  Cardiomyopathy suspected EXAM: MR CARDIA MORPHOLOGY  WITHOUT AND WITH CONTRAST; MR CARDIAC VELOCITY FLOW MAPPING TECHNIQUE: The patient was scanned on a 1.5 Tesla Siemens magnet. A dedicated cardiac coil was used. Functional imaging was done using TrueFisp sequences. 2,3, and 4 chamber views were done to assess for RWMA's. Modified Simpson's rule using a short axis stack was used to calculate an ejection fraction on a dedicated work [employer]. The patient received 10mL GADAVIST GADOBUTROL 1 MMOL/ML IV SOLN. After 10 minutes inversion recovery sequences were used to assess for infiltration and scar tissue. Phase contrast velocity encoded images obtained x 2. This examination is tailored for evaluation cardiac anatomy and function and provides very limited assessment of noncardiac structures, which are accordingly not evaluated during interpretation. If there is clinical concern for extracardiac pathology, further evaluation with CT imaging should be considered. FINDINGS: LEFT VENTRICLE: Severely dilated left ventricular cavity. Maximum septal wall thickness: 1.3 cm. Posterior wall thickness 1.1 cm. Left ventricular internal diameter (diastole): 6.8 cm. There are no regional wall motion abnormalities. Global hypokinesis. LV EF: 24% (Normal 49-79%) Absolute volumes: LV EDV: 339 mL (Normal 95-215 mL) LV ESV: 256 mL (Normal 25-85 mL) LV SV: 83 mL (Normal 61-145 mL) CO: 7.7 L/min (Normal 3.4-7.8 L/min) Indexed volumes: LV EDV: 116 mL/sq-m (Normal 50-108 mL/sq-m) LV ESV: 88 mL/sq-m (Normal 11-47 mL/sq-m) LV SV: 28 mL/sq-m (Normal 33-72 mL/sq-m) CI: 2.6 L/min/sq-m (Normal 1.8-4.2 L/min/sq-m) RIGHT VENTRICLE: Normal right ventricular chamber size. Normal right ventricular wall thickness. Normal right ventricular systolic function. There are no regional wall motion abnormalities. RV EF:  53% (Normal 51-80%) Absolute volumes: RV EDV: 218 mL (Normal 109-217 mL) RV ESV: 102 mL (Normal 23-91 mL) RV SV: 116 mL (Normal 71-141 mL) CO: 10.9 L/min (Normal 2.8-8.8  L/min) Indexed volumes: RV EDV: 75 mL/sq-m (Normal 58-109 mL/sq-m) RV ESV: 35 mL/sq-m (Normal 12-46 mL/sq-m) RV SV: 40 mL/sq-m (Normal 38-71 mL/sq-m) CI: 3.7 L/min/sq-m (Normal 1.7-4.2 L/min/sq-m) Left atrium: Normal size. Right atrium: Normal size. Mitral valve: Normal mitral valve without regurgitation. Aortic valve: Tricuspid aortic valve without regurgitation. Tricuspid valve: Normal tricuspid valve without regurgitation. Pulmonic valve: Normal pulmonary valve with trivial regurgitation. Aorta: The aortic root and proximal ascending aorta are normal in diameter. Pulmonary artery: Dilated suggestive of pulmonary hypertension. Pericardium: The pericardium is normal thickness. There is no pericardial effusion. There is no evidence of intracardiac thrombus. Systemic flow across the aortic valve (QS) was 7.1 L/min. Pulmonary flow across the pulmonic valve (QP) was 7.1 L/min. The QP:QS calculated across the aortic and pulmonic valves was 0.99. Native myocardial T1-relaxation times were normal  at 1030 ms. Native myocardial T2-relaxation times were normal at 47 ms. Delayed Enhancement: Faint mid wall LGE is present in the basal septum which is a nonspecific finding in a non-ischemic cardiomyopathy. No evidence of infarction, inflammation, or infiltrative disorder. Extracardiac structures: No significant findings IMPRESSION: 1. The left ventricle is severely dilated with mild concentric hypertrophy. Severely reduced left ventricular systolic function, LVEF 24%. Global hypokinesis. 2. The right ventricle is mildly dilated with normal systolic function. 3. No significant valvular heart disease. 4. Faint mid wall LGE is present in the basal septum which is a nonspecific finding in a non-ischemic cardiomyopathy. Normal T1/T2 values. No evidence of infarction, inflammation, or infiltrative disorder. Darryle Decent, MD Electronically Signed   By: Darryle Decent M.D.   On: 07/08/2024 10:53   MR CARDIAC VELOCITY FLOW  MAP Result Date: 07/08/2024 CLINICAL DATA:  Cardiomyopathy suspected EXAM: MR CARDIA MORPHOLOGY WITHOUT AND WITH CONTRAST; MR CARDIAC VELOCITY FLOW MAPPING TECHNIQUE: The patient was scanned on a 1.5 Tesla Siemens magnet. A dedicated cardiac coil was used. Functional imaging was done using TrueFisp sequences. 2,3, and 4 chamber views were done to assess for RWMA's. Modified Simpson's rule using a short axis stack was used to calculate an ejection fraction on a dedicated work [employer]. The patient received 10mL GADAVIST GADOBUTROL 1 MMOL/ML IV SOLN. After 10 minutes inversion recovery sequences were used to assess for infiltration and scar tissue. Phase contrast velocity encoded images obtained x 2. This examination is tailored for evaluation cardiac anatomy and function and provides very limited assessment of noncardiac structures, which are accordingly not evaluated during interpretation. If there is clinical concern for extracardiac pathology, further evaluation with CT imaging should be considered. FINDINGS: LEFT VENTRICLE: Severely dilated left ventricular cavity. Maximum septal wall thickness: 1.3 cm. Posterior wall thickness 1.1 cm. Left ventricular internal diameter (diastole): 6.8 cm. There are no regional wall motion abnormalities. Global hypokinesis. LV EF: 24% (Normal 49-79%) Absolute volumes: LV EDV: 339 mL (Normal 95-215 mL) LV ESV: 256 mL (Normal 25-85 mL) LV SV: 83 mL (Normal 61-145 mL) CO: 7.7 L/min (Normal 3.4-7.8 L/min) Indexed volumes: LV EDV: 116 mL/sq-m (Normal 50-108 mL/sq-m) LV ESV: 88 mL/sq-m (Normal 11-47 mL/sq-m) LV SV: 28 mL/sq-m (Normal 33-72 mL/sq-m) CI: 2.6 L/min/sq-m (Normal 1.8-4.2 L/min/sq-m) RIGHT VENTRICLE: Normal right ventricular chamber size. Normal right ventricular wall thickness. Normal right ventricular systolic function. There are no regional wall motion abnormalities. RV EF:  53% (Normal 51-80%) Absolute volumes: RV EDV: 218 mL (Normal 109-217 mL) RV  ESV: 102 mL (Normal 23-91 mL) RV SV: 116 mL (Normal 71-141 mL) CO: 10.9 L/min (Normal 2.8-8.8 L/min) Indexed volumes: RV EDV: 75 mL/sq-m (Normal 58-109 mL/sq-m) RV ESV: 35 mL/sq-m (Normal 12-46 mL/sq-m) RV SV: 40 mL/sq-m (Normal 38-71 mL/sq-m) CI: 3.7 L/min/sq-m (Normal 1.7-4.2 L/min/sq-m) Left atrium: Normal size. Right atrium: Normal size. Mitral valve: Normal mitral valve without regurgitation. Aortic valve: Tricuspid aortic valve without regurgitation. Tricuspid valve: Normal tricuspid valve without regurgitation. Pulmonic valve: Normal pulmonary valve with trivial regurgitation. Aorta: The aortic root and proximal ascending aorta are normal in diameter. Pulmonary artery: Dilated suggestive of pulmonary hypertension. Pericardium: The pericardium is normal thickness. There is no pericardial effusion. There is no evidence of intracardiac thrombus. Systemic flow across the aortic valve (QS) was 7.1 L/min. Pulmonary flow across the pulmonic valve (QP) was 7.1 L/min. The QP:QS calculated across the aortic and pulmonic valves was 0.99. Native myocardial T1-relaxation times were normal at 1030 ms. Native myocardial T2-relaxation times were  normal at 47 ms. Delayed Enhancement: Faint mid wall LGE is present in the basal septum which is a nonspecific finding in a non-ischemic cardiomyopathy. No evidence of infarction, inflammation, or infiltrative disorder. Extracardiac structures: No significant findings IMPRESSION: 1. The left ventricle is severely dilated with mild concentric hypertrophy. Severely reduced left ventricular systolic function, LVEF 24%. Global hypokinesis. 2. The right ventricle is mildly dilated with normal systolic function. 3. No significant valvular heart disease. 4. Faint mid wall LGE is present in the basal septum which is a nonspecific finding in a non-ischemic cardiomyopathy. Normal T1/T2 values. No evidence of infarction, inflammation, or infiltrative disorder. Darryle Decent, MD Electronically  Signed   By: Darryle Decent M.D.   On: 07/08/2024 10:53   ECHOCARDIOGRAM COMPLETE Result Date: 07/07/2024    ECHOCARDIOGRAM REPORT   Patient Name:   Dale Evans Date of Exam: 07/07/2024 Medical Rec #:  [id number]      Height:       75.0 in Accession #:    [id number]     Weight:       355.0 lb Date of Birth:  1991/02/25      BSA:          2.800 m Patient Age:    33 years       BP:           146/89 mmHg Patient Gender: M              HR:           100 bpm. Exam Location:  Inpatient Procedure: 2D Echo and Intracardiac Opacification Agent (Both Spectral and Color            Flow Doppler were utilized during procedure). Indications:    Dyspnea  History:        Patient has no prior history of Echocardiogram examinations.                 Risk Factors:Hypertension.  Sonographer:    Charmaine Gaskins Referring Phys: [id number] Yojan Paskett  Sonographer Comments: No subcostal window. Image acquisition challenging due to patient body habitus. IMPRESSIONS  1. Left ventricular ejection fraction, by estimation, is 30%. The left ventricle has severely decreased function. The left ventricle demonstrates global hypokinesis. The left ventricular internal cavity size was moderately to severely dilated. There is mild concentric left ventricular hypertrophy. Left ventricular diastolic parameters are consistent with Grade I diastolic dysfunction (impaired relaxation).  2. Right ventricular systolic function is normal. The right ventricular size is normal. Tricuspid regurgitation signal is inadequate for assessing PA pressure.  3. Left atrial size was moderately dilated.  4. The mitral valve is normal in structure. Trivial mitral valve regurgitation. No evidence of mitral stenosis.  5. The aortic valve is tricuspid. Aortic valve regurgitation is not visualized. No aortic stenosis is present.  6. IVC not well visualized, but does not appear to be dilated in limited views. Comparison(s): No prior Echocardiogram.  Conclusion(s)/Recommendation(s): New systolic dysfunction with LVEF estimated at 30%. Global hypkinesis with a dilated LV consistent with dilated cardiomyopathy. No significant valvular disease. FINDINGS  Left Ventricle: Left ventricular ejection fraction, by estimation, is 30%. The left ventricle has severely decreased function. The left ventricle demonstrates global hypokinesis. Definity contrast agent was given IV to delineate the left ventricular endocardial borders. The left ventricular internal cavity size was moderately to severely dilated. There is mild concentric left ventricular hypertrophy. Left ventricular diastolic parameters are consistent with Grade I diastolic dysfunction (impaired relaxation). Right  Ventricle: The right ventricular size is normal. No increase in right ventricular wall thickness. Right ventricular systolic function is normal. Tricuspid regurgitation signal is inadequate for assessing PA pressure. Left Atrium: Left atrial size was moderately dilated. Right Atrium: Right atrial size was normal in size. Pericardium: There is no evidence of pericardial effusion. Mitral Valve: The mitral valve is normal in structure. Trivial mitral valve regurgitation. No evidence of mitral valve stenosis. Tricuspid Valve: The tricuspid valve is normal in structure. Tricuspid valve regurgitation is not demonstrated. No evidence of tricuspid stenosis. Aortic Valve: The aortic valve is tricuspid. Aortic valve regurgitation is not visualized. No aortic stenosis is present. Pulmonic Valve: The pulmonic valve was normal in structure. Pulmonic valve regurgitation is trivial. No evidence of pulmonic stenosis. Aorta: The aortic root and ascending aorta are structurally normal, with no evidence of dilitation. Venous: IVC not well visualized, but does not appear to be dilated in limited views. IAS/Shunts: The interatrial septum was not well visualized.  LEFT VENTRICLE PLAX 2D LVIDd:         5.90 cm      Diastology  LVIDs:         5.10 cm      LV e' medial:    5.87 cm/s LV PW:         1.40 cm      LV E/e' medial:  16.6 LV IVS:        1.30 cm      LV e' lateral:   9.03 cm/s LVOT diam:     2.20 cm      LV E/e' lateral: 10.8 LVOT Area:     3.80 cm  LV Volumes (MOD) LV vol d, MOD A2C: 269.0 ml LV vol d, MOD A4C: 297.0 ml LV vol s, MOD A2C: 177.0 ml LV vol s, MOD A4C: 197.0 ml LV SV MOD A2C:     92.0 ml LV SV MOD A4C:     297.0 ml LV SV MOD BP:      95.8 ml RIGHT VENTRICLE RV Basal diam:  3.30 cm     PULMONARY VEINS RV Mid diam:    3.40 cm     Diastolic Velocity: 58.70 cm/s RV S prime:     17.40 cm/s  S/D Velocity:       0.70                             Systolic Velocity:  40.90 cm/s LEFT ATRIUM              Index        RIGHT ATRIUM           Index LA diam:        5.10 cm  1.82 cm/m   RA Area:     19.80 cm LA Vol (A2C):   118.0 ml 42.14 ml/m  RA Volume:   55.30 ml  19.75 ml/m LA Vol (A4C):   129.0 ml 46.06 ml/m LA Biplane Vol: 124.0 ml 44.28 ml/m   AORTA Ao Root diam: 3.30 cm Ao Asc diam:  3.30 cm MITRAL VALVE MV Area (PHT): 7.16 cm    SHUNTS MV Decel Time: 106 msec    Systemic Diam: 2.20 cm MV E velocity: 97.40 cm/s MV A velocity: 67.30 cm/s MV E/A ratio:  1.45 Georganna Archer Electronically signed by Georganna Archer Signature Date/Time: 07/07/2024/4:06:09 PM    Final    CT Angio Chest PE W  and/or Wo Contrast Result Date: 07/07/2024 EXAM: CTA of the Chest with contrast for PE 07/07/2024 06:41:50 AM TECHNIQUE: CTA of the chest was performed without and with the administration of 75 mL iohexol  (OMNIPAQUE ) 350 MG/ML injection. Multiplanar reformatted images are provided for review. MIP images are provided for review. Automated exposure control, iterative reconstruction, and/or weight based adjustment of the mA/kV was utilized to reduce the radiation dose to as low as reasonably achievable. COMPARISON: Chest radiographs earlier today, CTA chest 03/20/2019. CLINICAL HISTORY: 33 year old male. PE suspected, low to  intermediate prob, positive D-dimer. c/o CP, SOB, and sore throat x 1 week that is gradually getting worse. FINDINGS: PULMONARY ARTERIES: Pulmonary arteries are adequately opacified for evaluation. Mild respiratory motion in both lower lungs. No pulmonary embolism. Main pulmonary artery is normal in caliber. MEDIASTINUM: The heart demonstrates no acute abnormality. Heart size is stable. No pericardial effusion. Suboptimal contrast opacification of the thoracic aorta, but no acute abnormality is visible. No evidence of calcified coronary artery atherosclerosis. LYMPH NODES: No mediastinal, hilar or axillary lymphadenopathy. LUNGS AND PLEURA: Low lung volumes, lower compared to the prior CTA. Indistinct bilateral perihilar pulmonary ground glass opacity could be atelectasis, but acute bilateral viral / atypical respiratory infection is not excluded. No focal consolidation or pulmonary edema. No pleural effusion or pneumothorax. UPPER ABDOMEN: Hepatic steatosis which is new or increased from previous CTA. Negative visible other mostly non-contrast upper abdominal viscera. SOFT TISSUES AND BONES: Chronic age advanced thoracic posterior element degeneration at T5-T6 and T6-T7. Bulky and partially calcified ligamentum flavum hypertrophy at those levels. No acute soft tissue abnormality. No acute bone abnormality. IMPRESSION: 1. Mild motion artifact. No pulmonary embolism identified. 2. Low lung volumes with bilateral ground glass opacities, may be atelectasis but acute atypical/viral respiratory infection no excluded. 3. Hepatic steatosis. Electronically signed by: Helayne Hurst MD 07/07/2024 06:48 AM EDT RP Workstation: [id number]   DG Chest 2 View Result Date: 07/07/2024 EXAM: 2 VIEW(S) XRAY OF THE CHEST 07/07/2024 03:35:35 AM COMPARISON: 01/05/2020 CLINICAL HISTORY: chest pain. Chest Pain; Shortness of Breath; Sore Throat FINDINGS: LUNGS AND PLEURA: No focal pulmonary opacity. No frank interstitial edema. No pleural  effusion. No pneumothorax. HEART AND MEDIASTINUM: The heart is top normal in size, unchanged. No acute abnormality of the mediastinal silhouette. BONES AND SOFT TISSUES: No acute osseous abnormality. IMPRESSION: 1. No acute cardiopulmonary process. Electronically signed by: Pinkie Pebbles MD 07/07/2024 03:38 AM EDT RP Workstation: [id number]    Disposition Pt is being discharged home today in good condition.  Follow-up Plans & Appointments  Follow-up Information     [facility] up on 08/27/2024.   Specialty: Family Medicine Why: for new patient apt at 9:10,  office will call patient to see if they can work him in sooner. Contact information: [facility] [place]  [address] [phone] Additional information: [address]  [place], [place] [address]        Barbaraann Darryle Ned, MD. Go to.   Specialties: Cardiology, Internal Medicine, Radiology Why: Follow-up with Dr. Barbaraann on 07/25/2024 at 1 PM.  Arrive 15 minutes early. Contact information: [address] [place] [address] [phone]                  Discharge Medications Allergies as of 07/08/2024       Reactions   Licorice Flavor [licorice Flavoring Agent (non-screening)] Anaphylaxis   Amoxicillin Nausea And Vomiting, Swelling   Childhood allergy. Has patient had a PCN reaction causing  immediate rash, facial/tongue/throat swelling, SOB or lightheadedness with hypotension: No Has patient had a PCN reaction causing severe rash involving mucus membranes or skin necrosis: No Has patient had a PCN reaction that required hospitalization No Has patient had a PCN reaction occurring within the last 10 years: No If all of the above answers are NO, then may proceed with Cephalosporin use.   Bee Venom Rash        Medication List     TAKE these medications    acetaminophen  325 MG tablet Commonly known as: TYLENOL  Take 2 tablets (650 mg  total) by mouth 3 (three) times daily.   amLODipine  10 MG tablet Commonly known as: NORVASC  Take 1 tablet (10 mg total) by mouth daily.   atorvastatin 40 MG tablet Commonly known as: LIPITOR Take 1 tablet (40 mg total) by mouth daily. Start taking on: July 09, 2024   carvedilol 3.125 MG tablet Commonly known as: COREG Take 1 tablet (3.125 mg total) by mouth 2 (two) times daily with a meal.   empagliflozin 10 MG Tabs tablet Commonly known as: JARDIANCE Take 1 tablet (10 mg total) by mouth daily. Start taking on: July 09, 2024   irbesartan 150 MG tablet Commonly known as: AVAPRO Take 1 tablet (150 mg total) by mouth daily. Start taking on: July 09, 2024   metFORMIN 500 MG tablet Commonly known as: GLUCOPHAGE Take 1 tablet (500 mg total) by mouth 2 (two) times daily with a meal. Start taking on: July 09, 2024   spironolactone 25 MG tablet Commonly known as: ALDACTONE Take 1 tablet (25 mg total) by mouth daily. Start taking on: July 09, 2024         Outstanding Labs/Studies   Duration of Discharge Encounter: APP Time: 20 minutes   Signed, Morse Clause, PA-C 07/08/2024, 11:24 AM

## 2024-07-08 NOTE — TOC Transition Note (Signed)
 Transition of Care [facility]) - Discharge Note   Patient Details  Name: Dale Evans MRN: [id number] Date of Birth: January 09, 1991  Transition of Care [facility]) CM/SW Contact:  Waddell Barnie Rama, RN Phone Number: 07/08/2024, 12:00 PM   Clinical Narrative:    For dc today, he has no insurance, NCM assisting with meds with [employer] ,[facility] pharmacy will fill prior to dc.         Patient Goals and CMS Choice            Discharge Placement                       Discharge Plan and Services Additional resources added to the After Visit Summary for                                       Social Drivers of Health (SDOH) Interventions SDOH Screenings   Food Insecurity: No Food Insecurity (07/07/2024)  Housing: Unknown (07/07/2024)  Transportation Needs: No Transportation Needs (07/08/2024)  Utilities: Not At Risk (07/07/2024)  Alcohol Screen: Low Risk  (07/08/2024)  Financial Resource Strain: High Risk (07/08/2024)  Tobacco Use: Medium Risk (07/07/2024)     Readmission Risk Interventions     No data to display

## 2024-07-08 NOTE — TOC CM/SW Note (Addendum)
 Transition of Care [facility]) - Inpatient Brief Assessment   Patient Details  Name: Dale Evans MRN: [id number] Date of Birth: 11-14-1990  Transition of Care [facility]) CM/SW Contact:    Waddell Barnie Rama, RN Phone Number: 07/08/2024, 11:50 AM   Clinical Narrative: From home with spouse, has no PCP and no insurance on file, states has no HH services in place at this time or DME at home.  States he will drive himself home  at [employer] and family is support system, [facility] [address].  Pta self ambulatory.     He does not have any insurance, TOC pharmacy will fill for him and NCM assisting with medications with [employer].    Transition of Care Asessment: Insurance and Status: Insurance coverage has been reviewed Patient has primary care physician: Yes Home environment has been reviewed: home with wife Prior level of function:: indep Prior/Current Home Services: No current home services Social Drivers of Health Review: SDOH reviewed no interventions necessary Readmission risk has been reviewed: Yes Transition of care needs: no transition of care needs at this time

## 2024-07-08 NOTE — Progress Notes (Signed)
   Heart Failure Stewardship Pharmacist Progress Note   PCP: Pcp, No PCP-Cardiologist: Darryle ONEIDA Decent, MD    HPI:  33 yo M with PMH of HTN and sleep apnea, not taking medications for several months.   Presented to the ED on 10/27 with chest pain, shortness of breath, orthopnea, PND, chest discomfort, and sore throat x 1 week. EKG sinus tachycardia. Trop flat, BNP 137. CXR with no cardiopulmonary process. CTA negative for PE. ECHO 10/27 with LVEF 30%, global hypokinesis, mild concentric LVH, G1DD, RV normal. cMRI pending.   Denies shortness of breath or chest pain. States he is feeling better. BP remains elevated.   Current HF Medications: Beta Blocker: carvedilol 3.125 mg BID ACE/ARB/ARNI: Entresto 24/26 mg BID >> irbesartan 150 mg daily MRA: spironolactone 25 mg daily SGLT2i: Jardiance 10 mg daily  Prior to admission HF Medications: None  Pertinent Lab Values: Serum creatinine 1.24, BUN 16, Potassium 3.4, Sodium 135, BNP 137, Magnesium 2.0, A1c 7.7   Vital Signs: Weight: 364 lbs (admission weight: 364 lbs) Blood pressure: 160/100s  Heart rate: 80s  I/O: net -2L yesterday; net -1.8L since admission  Medication Assistance / Insurance Benefits Check: Does the patient have prescription insurance?  No  Does the patient qualify for medication assistance through manufacturers or grants?   Yes Eligible grants and/or patient assistance programs: Jardiance Medication assistance applications in progress: Jardiance Medication assistance applications approved: none Approved medication assistance renewals will be completed by: pending  Outpatient Pharmacy:  [facility] Is the patient willing to use [facility] TOC pharmacy at discharge? Yes Is the patient willing to transition their outpatient pharmacy to utilize a [facility] outpatient pharmacy?   No    Assessment: 1. Acute systolic CHF (LVEF 30%), pending cMRI. NYHA class III symptoms. - Volume status  improved - Agree with starting carvedilol 3.125 mg BID - On Entresto 24/26 mg BID - transitioning to ARB per cards. Discussed with patient and he can afford the ~$55 copay with the GoodRx card.  - Continue spironolactone 25 mg daily - Agree with starting Jardiance 10 mg daily   Plan: 1) Medication changes recommended at this time: - Restart Entresto 24/26 mg BID  2) Patient assistance: - Need to start Jardiance assistance - will need copy of his income due to household size of 5. He will receive free trial from [facility] pharmacy  3)  Education  - Patient has been educated on current HF medications and potential additions to HF medication regimen - Patient verbalizes understanding that over the next few months, these medication doses may change and more medications may be added to optimize HF regimen - Patient has been educated on basic disease state pathophysiology and goals of therapy   Duwaine Plant, PharmD, BCPS Heart Failure Stewardship Pharmacist Phone [phone]

## 2024-07-08 NOTE — Progress Notes (Signed)
 Heart Failure Nurse Navigator Progress Note  PCP: Pcp, No PCP-Cardiologist: AILENE Mulch  Admission Diagnosis: upper respiratory track infection, Chest pain, Hypertension, Non compliance with medication regimen.   Admitted from: Home   Presentation:   Dale Evans presented with complaints of chest pain, shortness of breath, and sore throat for 1 week. Reports to being off his BP medications for several months. BP 161/119, HR 102, BNP 1378, Trop 36. BMI 45.60. D-Dimer 0.90, CT negative for PE, CXR with no cardiopulmonary process. cMRI pending.   Patient and his wife were educated on the sign and symptoms of heart failure, daily weights, when to call his doctor or go to the ED. Diet/ fluid restrictions, taking all medications as prescribed, and attending all medical appointments. Patient will have TOC Pharmacy deliver his medications to bedside before discharge. A HF TOC appointment was scheduled for 07/14/2024 @ 8: 15 am.   ECHO/ LVEF: 30% New  Clinical Course:  Past Medical History:  Diagnosis Date   Bronchitis    Hypertension    Sleep apnea    hx of years ago per pt not now   Vertigo      Social History   Socioeconomic History   Marital status: Married    Spouse name: Not on file   Number of children: Not on file   Years of education: Not on file   Highest education level: Not on file  Occupational History   Not on file  Tobacco Use   Smoking status: Former    Types: Cigars   Smokeless tobacco: Never  Vaping Use   Vaping status: Never Used  Substance and Sexual Activity   Alcohol use: No   Drug use: Never    Comment: former user of CBD last used 12/11/19   Sexual activity: Not on file  Other Topics Concern   Not on file  Social History Narrative   ** Merged History Encounter **       Social Drivers of Health   Financial Resource Strain: Not on file  Food Insecurity: No Food Insecurity (07/07/2024)   Hunger Vital Sign    Worried About Running Out of Food in the  Last Year: Never true    Ran Out of Food in the Last Year: Never true  Transportation Needs: No Transportation Needs (07/07/2024)   PRAPARE - Administrator, Civil Service (Medical): No    Lack of Transportation (Non-Medical): No  Physical Activity: Not on file  Stress: Not on file  Social Connections: Not on file   Education Assessment and Provision:  Detailed education and instructions provided on heart failure disease management including the following:  Signs and symptoms of Heart Failure When to call the physician Importance of daily weights Low sodium diet Fluid restriction Medication management Anticipated future follow-up appointments  Patient education given on each of the above topics.  Patient acknowledges understanding via teach back method and acceptance of all instructions.  Education Materials:  Living Better With Heart Failure Booklet, HF zone tool, & Daily Weight Tracker Tool.  Patient has scale at home: Yes Patient has pill box at home: NA    High Risk Criteria for Readmission and/or Poor Patient Outcomes: Heart failure hospital admissions (last 6 months): 0  No Show rate: 0 %  Difficult social situation: No, Lives with his wife and children.  Demonstrates medication adherence: No Primary Language: English Literacy level: Reading, writing, and comprehension.   Barriers of Care:   Medication compliance Diet/ fluid  restrictions ( [place])  Daily weights New HF   Considerations/Referrals:   Referral made to Heart Failure Pharmacist Stewardship: Yes Referral made to Heart Failure CSW/NCM TOC: NA Referral made to Heart & Vascular TOC clinic: Yes, 07/14/2024 @ 8:15 am   Items for Follow-up on DC/TOC: Medication compliance Continued HF education Diet/ fluid restrictions/ daily weights   Stephane Haddock, BSN, RN Heart Failure Teacher, Adult Education Only

## 2024-07-08 NOTE — Progress Notes (Addendum)
   Patient Details: Name:  D[facility], Dale Evans MRN:   [id number] DOB:   [birth year] Reason for Current Admission: Acute systolic heart failure [facility])  Discharge Planning Narrative: Notification of discharge received by Hakan Daye.  Discharge follow-up appointments are current and available on the AVS and MyChart, RN reviewed with patient/spouse at bedside. RN reviewed medication list (dose, frequency, and rationale) of acetaminophen , atorvastatin, carvedilol, empagliflozin, irbesartan, metFORMIN, & spironolactone. Pt agreeable to discharge plans set by medical team. Pt declines wheelchair and wishes to ambulate independently off unit with spouse.  Vitals: 98 F, 147/95, HR 84, RR18, O2 sats 95% on RA.

## 2024-07-10 NOTE — Progress Notes (Signed)
 HEART & VASCULAR TRANSITION OF CARE CONSULT NOTE    Referring Physician: Dr. Myra PCP: Pcp, No   Chief Complaint: Systolic HF  HPI: Referred to clinic by Dr. Myra for heart failure consultation.   Dale Evans is a 33 y.o. male with recently diagnosed systolic heart failure, HTN, morbid obesity and OSA.   Started on amlodipine  during ED visit 2/25. Took until it ran out and never got refills. Did not have a PCP.   Admitted 10/25 with SOB, CP, orthopnea and PND. Echo showed EF 30%, LV with GHK, mild concentric LVH, GIDD, RV normal, LA mod dilated, trivial MR. cMRI 10/25: LVEF 25%, LV severly dilated with mild concentric hypertrophy, GHK, RV mildly dilated with normal systolic function, findings consistent with NiCM, no evidence of infarction, inflammation or infiltrative disease. Started on GDMT. Given 1 dose of IV lasix with appropriate response.   Today he presents for AHF [facility] clinic visit with his wife and daughter. Overall feeling ok just very fatigued. Denies palpitations, CP, dizziness or edema. Reports PND and orthopnea. SOB when going up stairs and when laying flat. Appetite ok, has been watching what he eats post discharge, prior to admission ate whatever he wanted. No fever or chills. Weight at home 351 pounds. Taking all medications. Denies ETOH, tobacco or drug use. Has been limiting fluid intake to 64oz in a day. 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 Has BP cuff at home, has not been using yet as he didn't know how often to take it. Snores. Retired engineer, agricultural.   Cardiac family history: HTN in mom, dad and grandparents.   Past Medical History:  Diagnosis Date   Bronchitis    Hypertension    Sleep apnea    hx of years ago per pt not now   Vertigo     Current Outpatient Medications  Medication Sig Dispense Refill   acetaminophen  (TYLENOL ) 325 MG tablet Take 2 tablets (650 mg total) by mouth 3 (three) times daily. 42 tablet 0    atorvastatin (LIPITOR) 40 MG tablet Take 1 tablet (40 mg total) by mouth daily. 30 tablet 6   carvedilol (COREG) 3.125 MG tablet Take 1 tablet (3.125 mg total) by mouth 2 (two) times daily with a meal. 60 tablet 3   empagliflozin (JARDIANCE) 10 MG TABS tablet Take 1 tablet (10 mg total) by mouth daily. 30 tablet 6   irbesartan (AVAPRO) 150 MG tablet Take 1 tablet (150 mg total) by mouth daily. 30 tablet 3   metFORMIN (GLUCOPHAGE) 500 MG tablet Take 1 tablet (500 mg total) by mouth 2 (two) times daily with a meal. 60 tablet 6   spironolactone (ALDACTONE) 25 MG tablet Take 1 tablet (25 mg total) by mouth daily. 30 tablet 6   No current facility-administered medications for this encounter.    Allergies  Allergen Reactions   Licorice Flavor [Licorice Flavoring Agent (Non-Screening)] Anaphylaxis   Amoxicillin Nausea And Vomiting and Swelling    Childhood allergy. Has patient had a PCN reaction causing immediate rash, facial/tongue/throat swelling, SOB or lightheadedness with hypotension: No Has patient had a PCN reaction causing severe rash involving mucus membranes or skin necrosis: No Has patient had a PCN reaction that required hospitalization No Has patient had a PCN reaction occurring within the last 10 years: No If all of the above answers are NO, then may proceed with Cephalosporin use.    Bee Venom Rash  Social History   Socioeconomic History   Marital status: Married    Spouse name: Maleka   Number of children: 3   Years of education: Not on file   Highest education level: High school graduate  Occupational History   Not on file  Tobacco Use   Smoking status: Former    Types: Cigars   Smokeless tobacco: Never  Vaping Use   Vaping status: Never Used  Substance and Sexual Activity   Alcohol use: No   Drug use: Never    Comment: former user of CBD last used 12/11/19   Sexual activity: Not on file  Other Topics Concern   Not on file  Social History Narrative   **  Merged History Encounter **       Social Drivers of Health   Financial Resource Strain: High Risk (07/08/2024)   Overall Financial Resource Strain (CARDIA)    Difficulty of Paying Living Expenses: Hard  Food Insecurity: No Food Insecurity (07/07/2024)   Hunger Vital Sign    Worried About Running Out of Food in the Last Year: Never true    Ran Out of Food in the Last Year: Never true  Transportation Needs: No Transportation Needs (07/08/2024)   PRAPARE - Administrator, Civil Service (Medical): No    Lack of Transportation (Non-Medical): No  Physical Activity: Not on file  Stress: Not on file  Social Connections: Not on file  Intimate Partner Violence: Not At Risk (07/07/2024)   Humiliation, Afraid, Rape, and Kick questionnaire    Fear of Current or Ex-Partner: No    Emotionally Abused: No    Physically Abused: No    Sexually Abused: No     Family History  Problem Relation Age of Onset   Healthy Mother    Cancer Mother    Hypertension Mother    Healthy Father    Diabetes Father    Hypertension Maternal Grandmother    Stroke Maternal Grandmother     Vitals:   07/14/24 0844  BP: 126/82  Pulse: 80  SpO2: 98%  Weight: (!) 160.5 kg (353 lb 12.8 oz)  Height: 6' 3 (1.905 m)   PHYSICAL EXAM: General:  well appearing.  No respiratory difficulty. Walked into clinic  Neck: JVD flat.  Cor: Regular rate & rhythm. No murmurs. Lungs: clear Extremities: no edema  Neuro: alert & oriented x 3. Affect pleasant.   Wt Readings from Last 3 Encounters:  07/14/24 (!) 160.5 kg (353 lb 12.8 oz)  07/08/24 (!) 165.5 kg (364 lb 13.8 oz)  07/22/23 (!) 165.6 kg (365 lb)    ECG: NSR 79 bpm (Personally reviewed)    ASSESSMENT & PLAN: Recently diagnosed systolic heart failure - Echo 89/74: EF 30%, LV with GHK, mild concentric LVH, GIDD, RV normal, LA mod dilated, trivial MR - Suspect hypertensive CM - cMRI 10/25: LVEF 25%, LV severly dilated with mild concentric hypertrophy,  GHK, RV mildly dilated with normal systolic function, findings consistent with NiCM, no evidence of infarction, inflammation or infiltrative disease.  - NYHA II-IIIa - Volume difficult to see but reports intt PND and orthopnea. Will start lasix 20 mg daily. Suspect we can eventually transition to PRN. Check BMET/BNP today. Added note for repeat BMET in 2 weeks when he follows up with Dr. Barbaraann.  - Continue Jardiance 10 mg daily, A1c 7.7 10/25 - Continue spiro 25 mg daily - Continue coreg 3.125 mg BID. With ongoing fatigue will hold off on up titrating today. Consider  switching to Toprol XL at bedtime if anemia panel negative and still fatigued at f/u.  - Continue irbesartan 150 mg daily. Uninsured (not on Entresto).  - Would repeat echo in ~ 2 months, BP much better controlled now. He is doing really good about watching what he eats and limiting fluid intake.   HTN - BP stable today. Asked him to take BP daily and bring log to follow up.  - Denies dizziness, tolerating meds well.   Obesity - Body mass index is 44.22 kg/m.  - Consider addition of GLP-1. Discussed today, would be interested once he gets insurance.   OSA - Needs sleep study.  - Wife reports significant snoring.   Fatigue - Check anemia panel - Hold off on up titrating BB today. Consider switch to Toprol XL if fatigue does not improve by f/u   Has PCP arranged in December (on metformin, tolerating well). Working on museum/gallery curator.   Work note provided today for out of work until seen at next cards appt with ongoing fatigue and PND/orthopnea.    Referred to HFSW (PCP, Medications, Transportation, ETOH Abuse, Drug Abuse, Insurance, Financial ): No Refer to Pharmacy: No Refer to Home Health: No Refer to Advanced Heart Failure Clinic: Yes or no (reevaluate at follow up, may need to order repeat echo and reevaluate then) Refer to General Cardiology: Has f/u arranged with Dr. Barbaraann  Follow up in 3 weeks in TOC.

## 2024-07-14 ENCOUNTER — Encounter (HOSPITAL_COMMUNITY): Payer: Self-pay

## 2024-07-14 ENCOUNTER — Ambulatory Visit (HOSPITAL_COMMUNITY): Payer: Self-pay | Admitting: Internal Medicine

## 2024-07-14 ENCOUNTER — Encounter (HOSPITAL_COMMUNITY): Payer: Self-pay | Admitting: Internal Medicine

## 2024-07-14 ENCOUNTER — Other Ambulatory Visit (HOSPITAL_COMMUNITY): Payer: Self-pay

## 2024-07-14 ENCOUNTER — Observation Stay (HOSPITAL_COMMUNITY): Admit: 2024-07-14 | Discharge: 2024-07-14 | Disposition: A | Payer: Self-pay | Attending: Internal Medicine

## 2024-07-14 VITALS — BP 126/82 | HR 80 | Ht 75.0 in | Wt 353.8 lb

## 2024-07-14 DIAGNOSIS — I10 Essential (primary) hypertension: Secondary | ICD-10-CM

## 2024-07-14 DIAGNOSIS — G4733 Obstructive sleep apnea (adult) (pediatric): Secondary | ICD-10-CM

## 2024-07-14 DIAGNOSIS — Z6841 Body Mass Index (BMI) 40.0 and over, adult: Secondary | ICD-10-CM

## 2024-07-14 DIAGNOSIS — E66813 Obesity, class 3: Secondary | ICD-10-CM

## 2024-07-14 DIAGNOSIS — I5022 Chronic systolic (congestive) heart failure: Secondary | ICD-10-CM

## 2024-07-14 DIAGNOSIS — R5383 Other fatigue: Secondary | ICD-10-CM

## 2024-07-14 LAB — BASIC METABOLIC PANEL WITH GFR
Anion gap: 12 (ref 5–15)
BUN: 17 mg/dL (ref 6–20)
CO2: 21 mmol/L — ABNORMAL LOW (ref 22–32)
Calcium: 9.3 mg/dL (ref 8.9–10.3)
Chloride: 102 mmol/L (ref 98–111)
Creatinine, Ser: 1.6 mg/dL — ABNORMAL HIGH (ref 0.61–1.24)
GFR, Estimated: 58 mL/min — ABNORMAL LOW (ref >60)
Glucose, Bld: 134 mg/dL — ABNORMAL HIGH (ref 70–99)
Potassium: 3.9 mmol/L (ref 3.5–5.1)
Sodium: 135 mmol/L (ref 135–145)

## 2024-07-14 LAB — IRON AND TIBC
Iron: 56 ug/dL (ref 45–182)
Saturation Ratios: 15 % — ABNORMAL LOW (ref 17.9–39.5)
TIBC: 382 ug/dL (ref 250–450)
UIBC: 326 ug/dL

## 2024-07-14 LAB — FERRITIN: Ferritin: 240 ng/mL (ref 24–336)

## 2024-07-14 LAB — BRAIN NATRIURETIC PEPTIDE: B Natriuretic Peptide: 9.8 pg/mL (ref 0.0–100.0)

## 2024-07-14 MED ORDER — FUROSEMIDE 20 MG PO TABS: 20.0000 mg | ORAL_TABLET | Freq: Every day | ORAL | 3 refills | Status: DC | PRN

## 2024-07-14 MED ORDER — FUROSEMIDE 20 MG PO TABS
20.0000 mg | ORAL_TABLET | Freq: Every day | ORAL | 3 refills | Status: DC
  Filled 2024-07-14: qty 30, 30d supply, fill #0

## 2024-07-14 NOTE — Patient Instructions (Signed)
 Medication Changes:  START LASIX (FUROSEMIDE) 20MG  ONCE DAILY   Lab Work:  Labs done today, your results will be available in MyChart, we will contact you for abnormal readings.  AND THEN LABS AGAIN IN 2 WEEKS AT APPOINTMENT WITH DR. O'NEAL  Follow-Up in: 3 weeks with TOC clinic as scheduled   At the [facility], you and your health needs are our priority. We have a designated team specialized in the treatment of Heart Failure. This Care Team includes your primary Heart Failure Specialized Cardiologist (physician), Advanced Practice Providers (APPs- Physician Assistants and Nurse Practitioners), and Pharmacist who all work together to provide you with the care you need, when you need it.   You may see any of the following providers on your designated Care Team at your next follow up:  Dr. Toribio Fuel Dr. Ezra Shuck Dr. Odis Brownie Greig Mosses, NP Caffie Shed, [place] [facility] [place], [place] Beckey Coe, NP Jordan Lee, NP Tinnie Redman, PharmD   Please be sure to bring in all your medications bottles to every appointment.   Need to Contact Us :  If you have any questions or concerns before your next appointment please send us  a message through [place] or call our office at [phone].    TO LEAVE A MESSAGE FOR THE NURSE SELECT OPTION 2, PLEASE LEAVE A MESSAGE INCLUDING: YOUR NAME DATE OF BIRTH CALL BACK NUMBER REASON FOR CALL**this is important as we prioritize the call backs  YOU WILL RECEIVE A CALL BACK THE SAME DAY AS LONG AS YOU CALL BEFORE 4:00 PM

## 2024-07-20 ENCOUNTER — Emergency Department (HOSPITAL_COMMUNITY): Payer: Self-pay

## 2024-07-20 ENCOUNTER — Encounter (HOSPITAL_COMMUNITY): Payer: Self-pay

## 2024-07-20 ENCOUNTER — Emergency Department (HOSPITAL_COMMUNITY)
Admission: EM | Admit: 2024-07-20 | Discharge: 2024-07-20 | Disposition: A | Discharge status: Home / Self Care | Payer: Self-pay | Attending: Emergency Medicine | Admitting: Emergency Medicine

## 2024-07-20 ENCOUNTER — Other Ambulatory Visit: Payer: Self-pay

## 2024-07-20 DIAGNOSIS — I110 Hypertensive heart disease with heart failure: Secondary | ICD-10-CM | POA: Insufficient documentation

## 2024-07-20 DIAGNOSIS — R197 Diarrhea, unspecified: Secondary | ICD-10-CM | POA: Insufficient documentation

## 2024-07-20 DIAGNOSIS — Z79899 Other long term (current) drug therapy: Secondary | ICD-10-CM | POA: Insufficient documentation

## 2024-07-20 DIAGNOSIS — I5020 Unspecified systolic (congestive) heart failure: Secondary | ICD-10-CM | POA: Insufficient documentation

## 2024-07-20 DIAGNOSIS — R112 Nausea with vomiting, unspecified: Secondary | ICD-10-CM | POA: Insufficient documentation

## 2024-07-20 HISTORY — DX: Heart failure, unspecified: I50.9

## 2024-07-20 LAB — CBC
HCT: 37.7 % — ABNORMAL LOW (ref 39.0–52.0)
Hemoglobin: 13 g/dL (ref 13.0–17.0)
MCH: 28.5 pg (ref 26.0–34.0)
MCHC: 34.5 g/dL (ref 30.0–36.0)
MCV: 82.7 fL (ref 80.0–100.0)
Platelets: 268 K/uL (ref 150–400)
RBC: 4.56 MIL/uL (ref 4.22–5.81)
RDW: 12.4 % (ref 11.5–15.5)
WBC: 9.6 K/uL (ref 4.0–10.5)
nRBC: 0 % (ref 0.0–0.2)

## 2024-07-20 LAB — BRAIN NATRIURETIC PEPTIDE: B Natriuretic Peptide: 19.6 pg/mL (ref 0.0–100.0)

## 2024-07-20 LAB — LIPASE, BLOOD: Lipase: 22 U/L (ref 11–51)

## 2024-07-20 LAB — COMPREHENSIVE METABOLIC PANEL WITH GFR
ALT: 22 U/L (ref 0–44)
AST: 17 U/L (ref 15–41)
Albumin: 4.2 g/dL (ref 3.5–5.0)
Alkaline Phosphatase: 59 U/L (ref 38–126)
Anion gap: 15 (ref 5–15)
BUN: 16 mg/dL (ref 6–20)
CO2: 20 mmol/L — ABNORMAL LOW (ref 22–32)
Calcium: 9.5 mg/dL (ref 8.9–10.3)
Chloride: 101 mmol/L (ref 98–111)
Creatinine, Ser: 1.5 mg/dL — ABNORMAL HIGH (ref 0.61–1.24)
GFR, Estimated: >60 mL/min (ref >60)
Glucose, Bld: 136 mg/dL — ABNORMAL HIGH (ref 70–99)
Potassium: 3.8 mmol/L (ref 3.5–5.1)
Sodium: 136 mmol/L (ref 135–145)
Total Bilirubin: 0.8 mg/dL (ref 0.0–1.2)
Total Protein: 7.6 g/dL (ref 6.5–8.1)

## 2024-07-20 LAB — CBG MONITORING, ED: Glucose-Capillary: 141 mg/dL — ABNORMAL HIGH (ref 70–99)

## 2024-07-20 MED ORDER — ONDANSETRON 4 MG PO TBDP
4.0000 mg | ORAL_TABLET | Freq: Once | ORAL | Status: AC | PRN
  Administered 2024-07-20: 4 mg via ORAL
  Filled 2024-07-20: qty 1

## 2024-07-20 MED ORDER — ONDANSETRON HCL 4 MG PO TABS: 4.0000 mg | ORAL_TABLET | Freq: Four times a day (QID) | ORAL | 0 refills | Status: DC

## 2024-07-20 NOTE — ED Provider Notes (Signed)
 [place] EMERGENCY DEPARTMENT AT [facility] Provider Note   CSN: [id number] Arrival date & time: 07/20/24  9681     Patient presents with: Emesis   Dale Evans is a 33 y.o. male.  Patient with past medical history significant for systolic heart failure, recent upper respiratory tract infection, hypertension presents to the emergency department complaining of abdominal pain with nausea and vomiting over the past few hours and diarrhea throughout the day.  Patient also endorses mild shortness of breath similar to his recent admission for new onset CHF.  He endorses being compliant with his new medications since discharge.  He says he has some mild abdominal pain.  The majority of his family has had similar GI symptoms over the past week.  He denies chest pain, fever.    Emesis      Prior to Admission medications   Medication Sig Start Date End Date Taking? Authorizing Provider  ondansetron  (ZOFRAN ) 4 MG tablet Take 1 tablet (4 mg total) by mouth every 6 (six) hours. 07/20/24  Yes Logan Ubaldo NOVAK, PA-C  acetaminophen  (TYLENOL ) 325 MG tablet Take 2 tablets (650 mg total) by mouth 3 (three) times daily. Patient not taking: Reported on 07/14/2024 07/08/24   Adams, Zane, PA-C  atorvastatin (LIPITOR) 40 MG tablet Take 1 tablet (40 mg total) by mouth daily. 07/09/24   Adams, Zane, PA-C  carvedilol (COREG) 3.125 MG tablet Take 1 tablet (3.125 mg total) by mouth 2 (two) times daily with a meal. 07/08/24   Adams, Zane, PA-C  empagliflozin (JARDIANCE) 10 MG TABS tablet Take 1 tablet (10 mg total) by mouth daily. 07/09/24   Adams, Zane, PA-C  furosemide (LASIX) 20 MG tablet Take 1 tablet (20 mg total) by mouth daily as needed for fluid or edema (for weight gain of 3 pounds overnight or 5 pounds in one week). 07/14/24   Hayes Beckey CROME, NP  irbesartan (AVAPRO) 150 MG tablet Take 1 tablet (150 mg total) by mouth daily. 07/09/24   Adams, Zane, PA-C  metFORMIN (GLUCOPHAGE) 500 MG tablet Take  1 tablet (500 mg total) by mouth 2 (two) times daily with a meal. 07/09/24   Adams, Zane, PA-C  spironolactone (ALDACTONE) 25 MG tablet Take 1 tablet (25 mg total) by mouth daily. 07/09/24   Adams, Zane, PA-C    Allergies: Licorice flavor [licorice flavoring agent (non-screening)], Amoxicillin, and Bee venom    Review of Systems  Gastrointestinal:  Positive for vomiting.    Updated Vital Signs BP 114/68   Pulse 88   Temp 97.9 F (36.6 C)   Resp 15   Ht 6' 3 (1.905 m)   Wt (!) 158.3 kg   SpO2 97%   BMI 43.62 kg/m   Physical Exam Vitals and nursing note reviewed.  Constitutional:      General: He is not in acute distress.    Appearance: He is well-developed.  HENT:     Head: Normocephalic and atraumatic.  Eyes:     Conjunctiva/sclera: Conjunctivae normal.  Cardiovascular:     Rate and Rhythm: Normal rate and regular rhythm.     Heart sounds: No murmur heard. Pulmonary:     Effort: Pulmonary effort is normal. No respiratory distress.     Breath sounds: Normal breath sounds.  Abdominal:     Palpations: Abdomen is soft.     Tenderness: There is no abdominal tenderness.  Musculoskeletal:        General: No swelling.     Cervical back:  Neck supple.     Right lower leg: No edema.     Left lower leg: No edema.  Skin:    General: Skin is warm and dry.     Capillary Refill: Capillary refill takes less than 2 seconds.  Neurological:     Mental Status: He is alert.  Psychiatric:        Mood and Affect: Mood normal.     (all labs ordered are listed, but only abnormal results are displayed) Labs Reviewed  COMPREHENSIVE METABOLIC PANEL WITH GFR - Abnormal; Notable for the following components:      Result Value   CO2 20 (*)    Glucose, Bld 136 (*)    Creatinine, Ser 1.50 (*)    All other components within normal limits  CBC - Abnormal; Notable for the following components:   HCT 37.7 (*)    All other components within normal limits  CBG MONITORING, ED - Abnormal;  Notable for the following components:   Glucose-Capillary 141 (*)    All other components within normal limits  LIPASE, BLOOD  BRAIN NATRIURETIC PEPTIDE  URINALYSIS, ROUTINE W REFLEX MICROSCOPIC    EKG: None  Radiology: DG Chest 2 View Result Date: 07/20/2024 EXAM: 2 VIEW(S) XRAY OF THE CHEST 07/20/2024 04:21:42 AM COMPARISON: 07/07/2024 CLINICAL HISTORY: Dyspnea FINDINGS: LUNGS AND PLEURA: Ongoing low lung volumes. More lordotic positioning. No focal pulmonary opacity. No pulmonary edema. No pleural effusion. No pneumothorax. HEART AND MEDIASTINUM: Mild cardiomegaly, stable. BONES AND SOFT TISSUES: No acute osseous abnormality. UPPER ABDOMEN: Paucity of bowel gas. IMPRESSION: 1. No acute cardiopulmonary process. Electronically signed by: Helayne Hurst MD 07/20/2024 05:13 AM EST RP Workstation: [id number]     Procedures   Medications Ordered in the ED  ondansetron  (ZOFRAN -ODT) disintegrating tablet 4 mg (4 mg Oral Given 07/20/24 0337)                                    Medical Decision Making Amount and/or Complexity of Data Reviewed Labs: ordered. Radiology: ordered.  Risk Prescription drug management.   This patient presents to the ED for concern of nausea, vomiting, diarrhea, this involves an extensive number of treatment options, and is a complaint that carries with it a high risk of complications and morbidity.  The differential diagnosis includes gastroenteritis, cholecystitis, appendicitis, colitis, gastritis, others   Co morbidities / Chronic conditions that complicate the patient evaluation  As noted in HPI   Additional history obtained:  Additional history obtained from EMR External records from outside source obtained and reviewed including recent discharge summary from cardiology   Lab Tests:  I Ordered, and personally interpreted labs.  The pertinent results include: No leukocytosis, reassuring CMP with creatinine 1.5 consistent with creatinine from a few  days ago, BNP 19.6   Imaging Studies ordered:  I ordered imaging studies including chest x-ray I independently visualized and interpreted imaging which showed no acute findings I agree with the radiologist interpretation   Cardiac Monitoring: / EKG:  The patient was maintained on a cardiac monitor.  I personally viewed and interpreted the cardiac monitored which showed an underlying rhythm of: Sinus rhythm   Problem List / ED Course / Critical interventions / Medication management   I ordered medication including Zofran  Reevaluation of the patient after these medicines showed that the patient improved I have reviewed the patients home medicines and have made adjustments as needed    Social  Determinants of Health:  Patient has no primary care provider   Test / Admission - Considered:  Patient with reassuring workup.  No abdominal tenderness on exam.  Patient has multiple family members with similar symptoms.  Suspect viral gastroenteritis.  He is tolerating oral intake after Zofran .  Plan to prescribe course of Zofran  to discharge home.  Return precautions provided.      Final diagnoses:  Nausea vomiting and diarrhea    ED Discharge Orders          Ordered    ondansetron  (ZOFRAN ) 4 MG tablet  Every 6 hours        07/20/24 0609               Logan Ubaldo NOVAK, PA-C 07/20/24 9388    Midge Golas, MD 07/20/24 [phone]

## 2024-07-20 NOTE — ED Notes (Signed)
 Pt was able to tolerate 240 mL water without feeling nauseous.  Provider notified.

## 2024-07-20 NOTE — ED Triage Notes (Signed)
 Pt c/o fatigue, abdominal pain, nausea, and vomiting for past few hours. Pt has also had shortness of breath similar to when he previously had CHF.

## 2024-07-20 NOTE — Discharge Instructions (Addendum)
 Your symptoms this morning are consistent with a viral stomach illness.  Please take the prescribed nausea medication.  This should resolve on its own over a few days.  Please be sure to wash your hands with soap and water and advise family and friends to do the same to try to avoid spread of the virus.  If you develop any life-threatening symptoms return to the emergency department.

## 2024-07-21 ENCOUNTER — Ambulatory Visit (HOSPITAL_COMMUNITY): Payer: Self-pay | Admitting: Internal Medicine

## 2024-07-21 ENCOUNTER — Ambulatory Visit (HOSPITAL_COMMUNITY)
Admission: RE | Admit: 2024-07-21 | Discharge: 2024-07-21 | Disposition: A | Discharge status: Home / Self Care | Payer: Self-pay | Source: Ambulatory Visit | Attending: Internal Medicine | Admitting: Internal Medicine

## 2024-07-21 DIAGNOSIS — I5022 Chronic systolic (congestive) heart failure: Secondary | ICD-10-CM | POA: Insufficient documentation

## 2024-07-21 LAB — BASIC METABOLIC PANEL WITH GFR
Anion gap: 11 (ref 5–15)
BUN: 13 mg/dL (ref 6–20)
CO2: 24 mmol/L (ref 22–32)
Calcium: 9.2 mg/dL (ref 8.9–10.3)
Chloride: 103 mmol/L (ref 98–111)
Creatinine, Ser: 1.59 mg/dL — ABNORMAL HIGH (ref 0.61–1.24)
GFR, Estimated: 59 mL/min — ABNORMAL LOW (ref >60)
Glucose, Bld: 135 mg/dL — ABNORMAL HIGH (ref 70–99)
Potassium: 3.8 mmol/L (ref 3.5–5.1)
Sodium: 138 mmol/L (ref 135–145)

## 2024-07-24 NOTE — Progress Notes (Unsigned)
 Cardiology Office Note:  .   Date:  07/25/2024  ID:  Dale Evans, DOB [birth year], MRN [id number] PCP: Freddrick Johns  [facility] Providers Cardiologist:  Darryle ONEIDA Decent, MD { History of Present Illness: .    Chief Complaint  Patient presents with   Follow-up         Dale Evans is a 33 y.o. male with history of CHF, DM, HTN, obesity who presents for follow-up.    History of Present Illness   Dale Evans is a 33 year old male with systolic heart failure who presents for follow-up.  He was diagnosed with congestive heart failure two weeks ago during a hospital admission. Since then, he has experienced significant improvement in breathing and denies any leg swelling. He had a recent episode of chest pressure that resolved with rest. His current medications include carvedilol 3.125 mg twice daily, irbesartan 150 mg daily, spironolactone 25 mg daily, and Lasix as needed.  He has a history of hypertension and diabetes. His blood pressure has improved with current treatment. He is on Jardiance 10 mg daily, metformin, and Lipitor 40 mg daily for diabetes and hyperlipidemia management.  He experienced a recent gastrointestinal illness affecting his household, which led to vomiting and a subsequent hospital visit. He reports fluctuating energy levels and feels fatigued after physical activity.  He has a BMI of 43, indicating morbid obesity. He reports a recent weight fluctuation, with a decrease from 364 lbs to 350 lbs, and a current weight of 357 lbs. He is exploring options for weight management.  He is married with three children and works for [employer]. He is currently working on theatre manager. He does not smoke, drink, or use drugs. His father has diabetes, and his grandmother had heart issues post-COVID.          Problem List Systolic HF -EF 25% 06/2024 2. HTN 3. DM -A1c 7.7 4. HLD -T chol 209, HDL 27, LDL 123, TG 293    ROS: All other ROS reviewed  and negative. Pertinent positives noted in the HPI.     Studies Reviewed: SABRA       CMR 07/08/2024 IMPRESSION: 1. The left ventricle is severely dilated with mild concentric hypertrophy. Severely reduced left ventricular systolic function, LVEF 24%. Global hypokinesis.   2. The right ventricle is mildly dilated with normal systolic function.   3. No significant valvular heart disease.   4. Faint mid wall LGE is present in the basal septum which is a nonspecific finding in a non-ischemic cardiomyopathy. Normal T1/T2 values. No evidence of infarction, inflammation, or infiltrative disorder. Physical Exam:   VS:  BP 135/84   Pulse 70   Ht 6' 3 (1.905 m)   Wt (!) 357 lb (161.9 kg)   SpO2 97%   BMI 44.62 kg/m    Wt Readings from Last 3 Encounters:  07/25/24 (!) 357 lb (161.9 kg)  07/20/24 (!) 349 lb (158.3 kg)  07/14/24 (!) 353 lb 12.8 oz (160.5 kg)    GEN: Well nourished, well developed in no acute distress NECK: No JVD; No carotid bruits CARDIAC: RRR, no murmurs, rubs, gallops RESPIRATORY:  Clear to auscultation without rales, wheezing or rhonchi  ABDOMEN: Soft, non-tender, non-distended EXTREMITIES:  No edema; No deformity  ASSESSMENT AND PLAN: .   Assessment and Plan    Chronic systolic heart failure likely secondary to hypertension, EF 25-30% Chronic systolic heart failure, diagnosed two weeks ago. Cardiac MRI shows evidence of ischemic  etiology and nonspecific midwall basal septal LGE. Symptoms improved with treatment. Heart function expected to improve with optimized medication. - Increased carvedilol to 6.25 mg BID. - Continue irbesartan 150 mg daily. - Continue spironolactone 25 mg daily. - Jardiance 10 mg daily  - Allow Lasix as needed. - Order echocardiogram before next appointment. - Provided work note for leave until November 28th, 2025.  Essential hypertension Hypertension likely contributing to heart failure. Blood pressure improved with current  medication. - Continue irbesartan 150 mg daily.  Type 2 diabetes mellitus Type 2 diabetes managed with current medications. Discussed potential use of Ozempic for weight loss and glycemic control, pending primary care physician involvement. - Continue Jardiance 10 mg daily. - Refilled metformin. - Provided information on primary care physician for diabetes management.  Morbid obesity BMI of 43. Discussed potential benefits of Ozempic for weight loss, pending primary care physician involvement. - Provided information on primary care physician for weight management.  Mixed hyperlipidemia Managed with Lipitor. - Continue Lipitor 40 mg daily.               Follow-up: Return in about 3 months (around 10/25/2024).  Signed, Darryle DASEN. Barbaraann, MD, [facility]  [facility]  [address] [place], [place] [address] [phone]  1:35 PM

## 2024-07-25 ENCOUNTER — Ambulatory Visit: Payer: Self-pay | Attending: Cardiovascular Disease | Admitting: Cardiovascular Disease

## 2024-07-25 ENCOUNTER — Other Ambulatory Visit (HOSPITAL_COMMUNITY): Payer: Self-pay

## 2024-07-25 ENCOUNTER — Encounter: Payer: Self-pay | Admitting: Cardiovascular Disease

## 2024-07-25 VITALS — BP 135/84 | HR 70 | Ht 75.0 in | Wt 357.0 lb

## 2024-07-25 DIAGNOSIS — I5022 Chronic systolic (congestive) heart failure: Secondary | ICD-10-CM

## 2024-07-25 DIAGNOSIS — E782 Mixed hyperlipidemia: Secondary | ICD-10-CM

## 2024-07-25 DIAGNOSIS — I10 Essential (primary) hypertension: Secondary | ICD-10-CM

## 2024-07-25 MED ORDER — ATORVASTATIN CALCIUM 40 MG PO TABS
40.0000 mg | ORAL_TABLET | Freq: Every day | ORAL | 3 refills | Status: AC
  Filled 2024-07-25: qty 90, 90d supply, fill #0
  Filled 2024-08-04: qty 30, 30d supply, fill #0
  Filled 2024-08-27: qty 30, 30d supply, fill #1
  Filled 2024-10-01: qty 30, 30d supply, fill #2

## 2024-07-25 MED ORDER — EMPAGLIFLOZIN 10 MG PO TABS
10.0000 mg | ORAL_TABLET | Freq: Every day | ORAL | 3 refills | Status: AC
  Filled 2024-07-25: qty 90, 90d supply, fill #0
  Filled 2024-10-01: qty 30, 30d supply, fill #0

## 2024-07-25 MED ORDER — FUROSEMIDE 20 MG PO TABS
20.0000 mg | ORAL_TABLET | Freq: Every day | ORAL | 3 refills | Status: AC | PRN
  Filled 2024-07-25 – 2024-08-27 (×2): qty 30, 30d supply, fill #0
  Filled 2024-10-01: qty 30, 30d supply, fill #1

## 2024-07-25 MED ORDER — CARVEDILOL 6.25 MG PO TABS
6.2500 mg | ORAL_TABLET | Freq: Two times a day (BID) | ORAL | 3 refills | Status: AC
  Filled 2024-07-25: qty 60, 30d supply, fill #0
  Filled 2024-08-27: qty 60, 30d supply, fill #1
  Filled 2024-10-01: qty 60, 30d supply, fill #2

## 2024-07-25 MED ORDER — IRBESARTAN 150 MG PO TABS
150.0000 mg | ORAL_TABLET | Freq: Every day | ORAL | 3 refills | Status: AC
  Filled 2024-07-25: qty 90, 90d supply, fill #0
  Filled 2024-08-04: qty 30, 30d supply, fill #0
  Filled 2024-08-27: qty 30, 30d supply, fill #1
  Filled 2024-10-01: qty 30, 30d supply, fill #2

## 2024-07-25 MED ORDER — SPIRONOLACTONE 25 MG PO TABS
25.0000 mg | ORAL_TABLET | Freq: Every day | ORAL | 3 refills | Status: AC
  Filled 2024-07-25: qty 90, 90d supply, fill #0
  Filled 2024-08-04: qty 30, 30d supply, fill #0
  Filled 2024-08-27: qty 30, 30d supply, fill #1
  Filled 2024-10-01: qty 30, 30d supply, fill #2

## 2024-07-25 NOTE — Patient Instructions (Addendum)
 Medication Instructions:  Your physician has recommended you make the following change in your medication:   -Increase carvedilol (coreg) to 6.25mg  twice daily.  *If you need a refill on your cardiac medications before your next appointment, please call your pharmacy*  Lab Work: Today- BMET  If you have labs (blood work) drawn today and your tests are completely normal, you will receive your results only by: MyChart Message (if you have MyChart) OR A paper copy in the mail If you have any lab test that is abnormal or we need to change your treatment, we will call you to review the results.  Testing/Procedures: Your physician has requested that you have an echocardiogram. Echocardiography is a painless test that uses sound waves to create images of your heart. It provides your doctor with information about the size and shape of your heart and how well your heart's chambers and valves are working. This procedure takes approximately one hour. There are no restrictions for this procedure. Please do NOT wear cologne, perfume, aftershave, or lotions (deodorant is allowed). Please arrive 15 minutes prior to your appointment time.  Please note: We ask at that you not bring children with you during ultrasound (echo/ vascular) testing. Due to room size and safety concerns, children are not allowed in the ultrasound rooms during exams. Our front office staff cannot provide observation of children in our lobby area while testing is being conducted. An adult accompanying a patient to their appointment will only be allowed in the ultrasound room at the discretion of the ultrasound technician under special circumstances. We apologize for any inconvenience. **To do in February, prior to office visit**   Follow-Up: At [facility], you and your health needs are our priority.  As part of our continuing mission to provide you with exceptional heart care, our providers are all part of one team.  This  team includes your primary Cardiologist (physician) and Advanced Practice Providers or APPs (Physician Assistants and Nurse Practitioners) who all work together to provide you with the care you need, when you need it.  Your next appointment:   3 month(s)  Provider:   Darryle ONEIDA Decent, MD    We recommend signing up for the patient portal called MyChart.  Sign up information is provided on this After Visit Summary.  MyChart is used to connect with patients for Virtual Visits (Telemedicine).  Patients are able to view lab/test results, encounter notes, upcoming appointments, etc.  Non-urgent messages can be sent to your provider as well.   To learn more about what you can do with MyChart, go to [email or web address].   Other Instructions [facility] [address] [place], [place], [place] [address] Phone: [phone]

## 2024-07-26 LAB — BASIC METABOLIC PANEL WITH GFR
BUN/Creatinine Ratio: 12 (ref 9–20)
BUN: 15 mg/dL (ref 6–20)
CO2: 24 mmol/L (ref 20–29)
Calcium: 9.5 mg/dL (ref 8.7–10.2)
Chloride: 102 mmol/L (ref 96–106)
Creatinine, Ser: 1.27 mg/dL (ref 0.76–1.27)
Glucose: 104 mg/dL — ABNORMAL HIGH (ref 70–99)
Potassium: 4.2 mmol/L (ref 3.5–5.2)
Sodium: 141 mmol/L (ref 134–144)
eGFR: 77 mL/min/1.73 (ref >59)

## 2024-07-27 ENCOUNTER — Ambulatory Visit: Payer: Self-pay | Admitting: Cardiovascular Disease

## 2024-07-30 ENCOUNTER — Telehealth (HOSPITAL_BASED_OUTPATIENT_CLINIC_OR_DEPARTMENT_OTHER): Payer: Self-pay | Admitting: Licensed Clinical Social Worker

## 2024-07-30 NOTE — Progress Notes (Signed)
 Heart and Vascular Care Navigation  07/30/2024  Dale Evans May 27, 1991 [id number]  Reason for Referral: self pay, no PCP Patient is participating in a Managed Medicaid Plan:No, self pay only  Engaged with patient by telephone for initial visit for Heart and Vascular Care Coordination.                                                                                                   Assessment:     LCSW was able to reach pt today at [phone]. Introduced self, role, reason for call. Confirmed home address, full name and DOB. Pt resides at home with spouse and minor children. He works, but is currently out per MD orders. He does not have insurance through his job. He shares he was given a referral for PCP but they called and let him know their practice wasn't accepting referrals for new patients under a certain age.   Pt was updated that the teams with [facility] financial counseling had been calling him to screen for Medicaid. I will try and get specific caseworkers name so he can reach out directly. If not eligible we discussed [employer] navigator assistance and CAFA program. He also may be eligible for assistance through PAP for Jardiance  and [facility] for other medications if needed. Will also provide PCP list to connect with other practice if interested.   Pt shares provider (Dr. Barbaraann) discussed Ozempic, he isnt sure if that was something that had assistance. Encouraged him to f/u with team Monday and if agreeable we can provide assistance forms as well.  No additional questions, when asked states he is doing okay with transportation, rent, utilities. Encouraged him if this changes to let us  know. Explained that there are two social workers on our team and I will give assistance forms to my colleague Jenna to give him on Monday to prevent delays from mail.                                    HRT/VAS Care Coordination     Patients Home Cardiology Office [facility]    Outpatient Care Team Social Worker   Social Worker Name: Marit Fenton HUGHS, [phone]   Living arrangements for the past 2 months Apartment   Lives with: Spouse; Minor Children   Patient Current Insurance Coverage Self-Pay   Patient Has Concern With Paying Medical Bills Yes   Patient Concerns With Medical Bills recent hospital stay, ongoing medical work up   Medical Bill Referrals: [facility], [employer], CAFA   Does Patient Have Prescription Coverage? No   Patient Prescription Assistance Programs [place] [facility]; Patient Assistance Programs   Home Assistive Devices/Equipment None       Social History:  SDOH Screenings   Food Insecurity: No Food Insecurity (07/30/2024)  Housing: Low Risk  (07/30/2024)  Transportation Needs: No Transportation Needs (07/30/2024)  Utilities: Not At Risk (07/30/2024)  Alcohol Screen: Low Risk  (07/08/2024)  Financial Resource Strain: Medium Risk (07/30/2024)  Tobacco Use: Medium Risk (07/25/2024)  Health Literacy: Adequate Health Literacy (07/30/2024)    SDOH Interventions: Financial Resources:  Financial Strain Interventions: Other (Comment) (referred for [facility] screening for Medicaid, Marketplace and CAFA information to be provided; medication assistance and PAP also to be given) DSS for financial assistance and Financial Counseling for [employer] Program  Food Insecurity:  Food Insecurity Interventions: Intervention Not Indicated  Housing Insecurity:  Housing Interventions: Intervention Not Indicated  Transportation:   Transportation Interventions: Intervention Not Indicated     Other Care Navigation Interventions:     Provided Pharmacy assistance resources [place] [facility], Patient Assistance Programs   Follow-up plan:   LCSW will send Jenna, LCSW the following: [facility] contact, [facility], Marketplace flyer, CAFA, [employer] and Ozempic and  [employer]. Will remain available should pt continue to f/u with Heartcare.

## 2024-08-01 ENCOUNTER — Telehealth (HOSPITAL_COMMUNITY): Payer: Self-pay

## 2024-08-01 NOTE — Telephone Encounter (Signed)
 Called to confirm/remind patient of their appointment at the [facility] on 08/04/2024.   Appointment:   [] Confirmed  [x] Left mess   [] No answer/No voice mail  [] VM Full/unable to leave message  [] Phone not in service  Patient reminded to bring all medications and/or complete list.  Confirmed patient has transportation. Gave directions, instructed to utilize valet parking.

## 2024-08-03 NOTE — Progress Notes (Signed)
 HEART IMPACT TRANSITIONS OF CARE    PCP: Pcp, No  Primary Cardiologist: Darryle ONEIDA Decent, MD   Chief Complaint: HFrEF  HPI:  Dale Evans is a 33 y.o. male with recently diagnosed systolic heart failure, HTN, morbid obesity and OSA.    Started on amlodipine  during ED visit 2/25. Took until it ran out and never got refills. Did not have a PCP.    Admitted 10/25 with SOB, CP, orthopnea and PND. Echo showed EF 30%, LV with GHK, mild concentric LVH, GIDD, RV normal, LA mod dilated, trivial MR. cMRI 10/25: LVEF 25%, LV severly dilated with mild concentric hypertrophy, GHK, RV mildly dilated with normal systolic function, findings consistent with NiCM, no evidence of infarction, inflammation or infiltrative disease. Started on GDMT. Given 1 dose of IV lasix  with appropriate response.   Presented to the ED 11/9.25 with viral gastroenteritis.   [facility] f/u 11/14, coreg  up titrated. Plans to pick up today, did not double dose at home.   Today he returns for AHF TOC follow up. Overall feeling great. Denies palpitations, CP, dizziness, edema, or PND/Orthopnea. No SOB. Appetite ok. No fever or chills. Weight at home 346 pounds. Taking all medications. Denies ETOH, tobacco or drug use. SBP at home 120s-130s. Wants to go back to the gym. Discussed that this is great but he need to take it easy and take breaks as needed.    Cardiac family history: HTN in mom, dad and grandparents.    ROS: All systems negative except as listed in HPI, PMH and Problem List.  SH:  Social History   Socioeconomic History   Marital status: Married    Spouse name: Gaffer   Number of children: 3   Years of education: Not on file   Highest education level: High school graduate  Occupational History   Occupation: [employer]  Tobacco Use   Smoking status: Former    Types: Cigars   Smokeless tobacco: Never  Vaping Use   Vaping status: Never Used  Substance and Sexual Activity   Alcohol use: No   Drug use:  Never    Comment: former user of CBD last used 12/11/19   Sexual activity: Not on file  Other Topics Concern   Not on file  Social History Narrative   ** Merged History Encounter **       Social Drivers of Health   Financial Resource Strain: Medium Risk (07/30/2024)   Overall Financial Resource Strain (CARDIA)    Difficulty of Paying Living Expenses: Somewhat hard  Food Insecurity: No Food Insecurity (07/30/2024)   Hunger Vital Sign    Worried About Running Out of Food in the Last Year: Never true    Ran Out of Food in the Last Year: Never true  Transportation Needs: No Transportation Needs (07/30/2024)   PRAPARE - Administrator, Civil Service (Medical): No    Lack of Transportation (Non-Medical): No  Physical Activity: Not on file  Stress: Not on file  Social Connections: Not on file  Intimate Partner Violence: Not At Risk (07/07/2024)   Humiliation, Afraid, Rape, and Kick questionnaire    Fear of Current or Ex-Partner: No    Emotionally Abused: No    Physically Abused: No    Sexually Abused: No    FH:  Family History  Problem Relation Age of Onset   Healthy Mother    Cancer Mother    Hypertension Mother    Healthy Father    Diabetes  Father    Hypertension Maternal Grandmother    Stroke Maternal Grandmother     Past Medical History:  Diagnosis Date   Bronchitis    CHF (congestive heart failure) (HCC)    Hypertension    Sleep apnea    hx of years ago per pt not now   Vertigo     Current Outpatient Medications  Medication Sig Dispense Refill   acetaminophen  (TYLENOL ) 325 MG tablet Take 2 tablets (650 mg total) by mouth 3 (three) times daily. (Patient taking differently: Take 650 mg by mouth as needed for moderate pain (pain score 4-6).) 42 tablet 0   atorvastatin  (LIPITOR) 40 MG tablet Take 1 tablet (40 mg total) by mouth daily. 90 tablet 3   carvedilol  (COREG ) 6.25 MG tablet Take 1 tablet (6.25 mg total) by mouth 2 (two) times daily with a meal.  180 tablet 3   empagliflozin  (JARDIANCE ) 10 MG TABS tablet Take 1 tablet (10 mg total) by mouth daily. 90 tablet 3   furosemide  (LASIX ) 20 MG tablet Take 1 tablet (20 mg total) by mouth daily as needed for fluid or edema (for weight gain of 3 pounds overnight or 5 pounds in one week). 30 tablet 3   irbesartan  (AVAPRO ) 150 MG tablet Take 1 tablet (150 mg total) by mouth daily. 90 tablet 3   metFORMIN  (GLUCOPHAGE ) 500 MG tablet Take 1 tablet (500 mg total) by mouth 2 (two) times daily with a meal. 60 tablet 6   spironolactone  (ALDACTONE ) 25 MG tablet Take 1 tablet (25 mg total) by mouth daily. 90 tablet 3   ondansetron  (ZOFRAN ) 4 MG tablet Take 1 tablet (4 mg total) by mouth every 6 (six) hours. (Patient not taking: Reported on 08/04/2024) 12 tablet 0   No current facility-administered medications for this encounter.    Vitals:   08/04/24 1120  BP: (!) 144/94  Pulse: 80  SpO2: 97%  Weight: (!) 158.8 kg (350 lb 3.2 oz)  Height: 6' 3 (1.905 m)    PHYSICAL EXAM: General:  well appearing.  No respiratory difficulty. Walked into clinic.  Neck: JVD flat.  Cor: Regular rate & rhythm. No murmurs. Lungs: diminished Extremities: no edema  Neuro: alert & oriented x 3. Affect pleasant.   ECG: none today   ASSESSMENT & PLAN: Recently diagnosed systolic heart failure - Echo 89/74: EF 30%, LV with GHK, mild concentric LVH, GIDD, RV normal, LA mod dilated, trivial MR - Suspect hypertensive CM - cMRI 10/25: LVEF 25%, LV severly dilated with mild concentric hypertrophy, GHK, RV mildly dilated with normal systolic function, findings consistent with NiCM, no evidence of infarction, inflammation or infiltrative disease.  - NYHA II - Euvolemic on exam. No more PND/orthopnea reported. Continue lasix  PRN.  - Continue Jardiance  10 mg daily, A1c 7.7 10/25 - Continue spiro 25 mg daily - Continue coreg  6.25 mg BID. (Picking up today) - Continue irbesartan  150 mg daily. Uninsured (not on Entresto ).  Would  increase to 300 if BP still high at f/u or switch to Entresto  if insured.  - Echo scheduled in February. He is doing really good about watching what he eats and limiting fluid intake.    HTN - BP elevated. Picking higher coreg  dose today.  - Denies dizziness, tolerating meds well.    Obesity - Body mass index is 43.77 kg/m.  - Consider addition of GLP-1. Would be interested once he gets insurance.    OSA - Needs sleep study. Working on community education officer.  - Wife  reports significant snoring.    Fatigue - Much improved.  - Anemia panel stable.    Has PCP arranged in December (on metformin , tolerating well).   Returning to work soon, got accommodations so that he would stay local in [place] to avoid long distance driving.     HFSW to see today to discuss PCP and insurance.    Referred to HFSW (PCP, Medications, Transportation, ETOH Abuse, Drug Abuse, Insurance, Surveyor, Quantity ): Seeing today Refer to Pharmacy: No Refer to Home Health: No Refer to Advanced Heart Failure Clinic: Yes or no (plan to see one more time to ensure BP better controlled. Echo scheduled in February, would see once more after that, if EF improved can follow with [facility]. If lower/same would keep in AHF clinic) Refer to General Cardiology: Has f/u arranged with Dr. Barbaraann + echo.    Follow up in ~6 weeks to reassess BP.

## 2024-08-04 ENCOUNTER — Other Ambulatory Visit (HOSPITAL_COMMUNITY): Payer: Self-pay

## 2024-08-04 ENCOUNTER — Encounter (HOSPITAL_COMMUNITY): Payer: Self-pay

## 2024-08-04 ENCOUNTER — Telehealth: Payer: Self-pay | Admitting: Pharmacy Technician

## 2024-08-04 ENCOUNTER — Ambulatory Visit (HOSPITAL_COMMUNITY)
Admission: RE | Admit: 2024-08-04 | Discharge: 2024-08-04 | Disposition: A | Discharge status: Home / Self Care | Payer: Self-pay | Source: Ambulatory Visit | Attending: Internal Medicine | Admitting: Internal Medicine

## 2024-08-04 VITALS — BP 144/94 | HR 80 | Ht 75.0 in | Wt 350.2 lb

## 2024-08-04 DIAGNOSIS — I5022 Chronic systolic (congestive) heart failure: Secondary | ICD-10-CM | POA: Insufficient documentation

## 2024-08-04 DIAGNOSIS — I10 Essential (primary) hypertension: Secondary | ICD-10-CM

## 2024-08-04 DIAGNOSIS — G4733 Obstructive sleep apnea (adult) (pediatric): Secondary | ICD-10-CM | POA: Insufficient documentation

## 2024-08-04 DIAGNOSIS — Z79899 Other long term (current) drug therapy: Secondary | ICD-10-CM | POA: Insufficient documentation

## 2024-08-04 DIAGNOSIS — Z6841 Body Mass Index (BMI) 40.0 and over, adult: Secondary | ICD-10-CM | POA: Insufficient documentation

## 2024-08-04 DIAGNOSIS — I110 Hypertensive heart disease with heart failure: Secondary | ICD-10-CM | POA: Insufficient documentation

## 2024-08-04 DIAGNOSIS — Z5971 Insufficient health insurance coverage: Secondary | ICD-10-CM | POA: Insufficient documentation

## 2024-08-04 DIAGNOSIS — Z7984 Long term (current) use of oral hypoglycemic drugs: Secondary | ICD-10-CM | POA: Insufficient documentation

## 2024-08-04 DIAGNOSIS — R5383 Other fatigue: Secondary | ICD-10-CM | POA: Insufficient documentation

## 2024-08-04 NOTE — Addendum Note (Signed)
 Encounter addended by: Cathern Andriette DEL, LCSW on: 08/04/2024 4:20 PM  Actions taken: Clinical Note Signed

## 2024-08-04 NOTE — Telephone Encounter (Signed)
 Hi, can someone please get the provider to sign the form that is scanned in media under [employer] (BI Cares) PROVIDER APPLICATION for this patient and fax back to [phone]? Thank you!

## 2024-08-04 NOTE — Progress Notes (Signed)
 Pt signed Jardiance  pt assist application and samples provided  Medication Samples have been provided to the patient.  Drug name: Jardiance        Strength: 10mg         Qty: 5  LOT: [id number] x3, [id number] x2  Exp.Date: 12/09/25 for all bottles  Dosing instructions: Take 1 tab Daily  The patient has been instructed regarding the correct time, dose, and frequency of taking this medication, including desired effects and most common side effects.   Dale Evans 12:15 PM 08/04/2024

## 2024-08-04 NOTE — Progress Notes (Signed)
 H&V Care Navigation CSW Progress Note  Clinical Social Worker met with pt to provide information regarding community resources.  Provided number to [employer] who has been trying to contact him to screen for Medicaid.  Encouraged him to call and see if he would qualify.  Provided with CAFA in case he is not eligible for Medicaid.  Provided with NCMedassist app to assist with medication costs.  Provided with information regarding ACA insurance to see if that would be affordable as he reports his employer does not provide insurance.    Had pt sign BI Cares app to help with jardiance - faxed to [facility] Patient Advocate team.  Will continue to follow and assist as needed  Andriette HILARIO Leech, LCSW Clinical Social Worker [facility] Desk#: [phone] Cell#: [phone]

## 2024-08-04 NOTE — Patient Instructions (Signed)
 Medication Changes:  None, continue current medications   Special Instructions // Education:  Do the following things EVERYDAY: Weigh yourself in the morning before breakfast. Write it down and keep it in a log. Take your medicines as prescribed Eat low salt foods--Limit salt (sodium) to 2000 mg per day.  Stay as active as you can everyday Limit all fluids for the day to less than 2 liters   Follow-Up in: Thank you for allowing us  to provider your heart failure care after your recent hospitalization. Please follow-up with us  again in 4-6 weeks   If you have any questions, issues, or concerns before your next appointment please call our office at [phone], opt. 2 and leave a message for the triage nurse.

## 2024-08-05 NOTE — Telephone Encounter (Signed)
 Provider portion of application printed and signed by Dr. Barbaraann. Faxed back to number provided.

## 2024-08-05 NOTE — Telephone Encounter (Signed)
 Faxed bicares jardiance  application

## 2024-08-11 ENCOUNTER — Encounter (HOSPITAL_COMMUNITY): Payer: Self-pay | Admitting: Internal Medicine

## 2024-08-11 NOTE — Telephone Encounter (Signed)
 I called bi-cares to check on the status and they said they are working on it and will let us  know soon

## 2024-08-12 ENCOUNTER — Telehealth: Payer: Self-pay | Admitting: Licensed Clinical Social Worker

## 2024-08-12 NOTE — Telephone Encounter (Signed)
 H&V Care Navigation CSW Progress Note  Clinical Social Worker contacted patient by phone to f/u on referral for Medicaid and patient assistance forms provided. No answer today at [phone], voicemail full. Will re-attempt again as able.  Patient is participating in a Managed Medicaid Plan:  No, self pay only  SDOH Screenings   Food Insecurity: No Food Insecurity (07/30/2024)  Housing: Low Risk  (07/30/2024)  Transportation Needs: No Transportation Needs (07/30/2024)  Utilities: Not At Risk (07/30/2024)  Alcohol Screen: Low Risk  (07/08/2024)  Financial Resource Strain: Medium Risk (07/30/2024)  Tobacco Use: Medium Risk (08/04/2024)  Health Literacy: Adequate Health Literacy (07/30/2024)    Marit Lark, MSW, LCSW Clinical Social Worker II [facility] Health Heart/Vascular Care Navigation  [phone]- work cell phone (preferred)

## 2024-08-13 ENCOUNTER — Telehealth: Payer: Self-pay | Admitting: Licensed Clinical Social Worker

## 2024-08-13 NOTE — Telephone Encounter (Signed)
 H&V Care Navigation CSW Progress Note  Clinical Social Worker contacted patient by phone to f/u again on paperwork. Was able to reach pt today at [phone]. Pt confirmed he has paperwork but has been playing phone tag with [employer] financial counseling to complete Medicaid screening. He is returning to work tomorrow- says Friday is a better time for him to get a call. LCSW has emailed [place], assigned artist to see if she may be able to reach him then. Let him know also Jardiance  app is still pending at this time also, will let him know if any updates arise.  Patient is participating in a Managed Medicaid Plan:  No, self pay only  SDOH Screenings   Food Insecurity: No Food Insecurity (07/30/2024)  Housing: Low Risk  (07/30/2024)  Transportation Needs: No Transportation Needs (07/30/2024)  Utilities: Not At Risk (07/30/2024)  Alcohol Screen: Low Risk  (07/08/2024)  Financial Resource Strain: Medium Risk (07/30/2024)  Tobacco Use: Medium Risk (08/04/2024)  Health Literacy: Adequate Health Literacy (07/30/2024)     Marit Lark, MSW, LCSW Clinical Social Worker II [facility] Health Heart/Vascular Care Navigation  [phone]- work cell phone (preferred)

## 2024-08-14 NOTE — Telephone Encounter (Signed)
 [phone] per bi-cares they need the prescription portion sent in again  Faxed [phone] as requested

## 2024-08-19 NOTE — Telephone Encounter (Signed)
   Pap approval bicares jardiance

## 2024-08-20 ENCOUNTER — Telehealth: Payer: Self-pay | Admitting: Licensed Clinical Social Worker

## 2024-08-20 NOTE — Telephone Encounter (Signed)
 H&V Care Navigation CSW Progress Note  Clinical Social Worker contacted patient by phone to f/u on patient assistance and referral to [employer] team. Pt reached today at [phone]. He shares he thinks he connect with [place], artist but is currently at an appt for his kids at the dentist and is asking to touch base about 2pm today. Will re-attempt pt at that time.  Patient is participating in a Managed Medicaid Plan:  No, self pay only  SDOH Screenings   Food Insecurity: No Food Insecurity (07/30/2024)  Housing: Low Risk  (07/30/2024)  Transportation Needs: No Transportation Needs (07/30/2024)  Utilities: Not At Risk (07/30/2024)  Alcohol Screen: Low Risk  (07/08/2024)  Financial Resource Strain: Medium Risk (07/30/2024)  Tobacco Use: Medium Risk (08/04/2024)  Health Literacy: Adequate Health Literacy (07/30/2024)    Marit Lark, MSW, LCSW Clinical Social Worker II [facility] Health Heart/Vascular Care Navigation  [phone]- work cell phone (preferred)

## 2024-08-20 NOTE — Telephone Encounter (Signed)
 H&V Care Navigation CSW Progress Note  Clinical Social Worker contacted patient by phone to f/u again on patient assistance/Medicaid referral. No answer at [phone] and vm full. Sent text message just letting pt know I was attempting to reach him since he had given verbal permission for me to do so if no answer. Remain available, provided colleagues number since I will be out for the next two days.  Patient is participating in a Managed Medicaid Plan:  No, self pay only  SDOH Screenings   Food Insecurity: No Food Insecurity (07/30/2024)  Housing: Low Risk  (07/30/2024)  Transportation Needs: No Transportation Needs (07/30/2024)  Utilities: Not At Risk (07/30/2024)  Alcohol Screen: Low Risk  (07/08/2024)  Financial Resource Strain: Medium Risk (07/30/2024)  Tobacco Use: Medium Risk (08/04/2024)  Health Literacy: Adequate Health Literacy (07/30/2024)    Marit Lark, MSW, LCSW Clinical Social Worker II [facility] Heart/Vascular Care Navigation  [phone]- work cell phone (preferred)

## 2024-08-26 ENCOUNTER — Telehealth (INDEPENDENT_AMBULATORY_CARE_PROVIDER_SITE_OTHER): Payer: Self-pay | Admitting: Primary Care

## 2024-08-26 NOTE — Telephone Encounter (Signed)
 Called pt to reschedule appt. Pt did not answer and mailbox was too full to leave a message. Pt does call back, please reschedule appt because pt scheduled for a day provider will not be in office.

## 2024-08-27 ENCOUNTER — Ambulatory Visit (INDEPENDENT_AMBULATORY_CARE_PROVIDER_SITE_OTHER): Payer: Self-pay | Admitting: Primary Care

## 2024-08-27 ENCOUNTER — Telehealth (HOSPITAL_BASED_OUTPATIENT_CLINIC_OR_DEPARTMENT_OTHER): Payer: Self-pay | Admitting: Licensed Clinical Social Worker

## 2024-08-27 ENCOUNTER — Other Ambulatory Visit (HOSPITAL_COMMUNITY): Payer: Self-pay

## 2024-08-27 NOTE — Telephone Encounter (Signed)
 H&V Care Navigation CSW Progress Note  Clinical Social Worker contacted patient by phone to f/u on patient assistance. Pt account has been closed by [employer]. When I reach pt at [phone] he shares he played phone tag and was never able to reach them. LCSW received permission to send his info over to DSS, was able to send to caseworker who will reach out to pt. Pt otherwise doing well- taking meds and attending work. Shares he will have work benefits in the new year had questions about signing up, encouraged him to contact benefits administrator if not eligible for Medicaid.   Patient is participating in a Managed Medicaid Plan:  No, self pay only  SDOH Screenings   Food Insecurity: No Food Insecurity (07/30/2024)  Housing: Low Risk (07/30/2024)  Transportation Needs: No Transportation Needs (07/30/2024)  Utilities: Not At Risk (07/30/2024)  Alcohol Screen: Low Risk (07/08/2024)  Financial Resource Strain: Medium Risk (07/30/2024)  Tobacco Use: Medium Risk (08/04/2024)  Health Literacy: Adequate Health Literacy (07/30/2024)    Marit Lark, MSW, LCSW Clinical Social Worker II [facility] Health Heart/Vascular Care Navigation  [phone]- work cell phone (preferred)

## 2024-08-29 ENCOUNTER — Telehealth (HOSPITAL_COMMUNITY): Payer: Self-pay

## 2024-08-29 NOTE — Telephone Encounter (Signed)
 Called to confirm/remind patient of their appointment at the [facility] Clinic on 09/01/24 11:15.   Appointment:   [x] Confirmed  [] Left mess   [] No answer/No voice mail  [] VM Full/unable to leave message  [] Phone not in service  Patient reminded to bring all medications and/or complete list.  Confirmed patient has transportation. Gave directions, instructed to utilize valet parking.

## 2024-09-01 ENCOUNTER — Ambulatory Visit (HOSPITAL_COMMUNITY): Payer: Self-pay

## 2024-09-02 ENCOUNTER — Ambulatory Visit (HOSPITAL_COMMUNITY): Payer: Self-pay | Admitting: Cardiology

## 2024-09-02 ENCOUNTER — Encounter (HOSPITAL_COMMUNITY): Payer: Self-pay

## 2024-09-02 ENCOUNTER — Ambulatory Visit (HOSPITAL_COMMUNITY)
Admission: RE | Admit: 2024-09-02 | Discharge: 2024-09-02 | Disposition: A | Discharge status: Home / Self Care | Payer: Self-pay | Source: Ambulatory Visit | Attending: Cardiology | Admitting: Cardiology

## 2024-09-02 VITALS — BP 118/78 | HR 78 | Wt 344.4 lb

## 2024-09-02 DIAGNOSIS — E119 Type 2 diabetes mellitus without complications: Secondary | ICD-10-CM | POA: Insufficient documentation

## 2024-09-02 DIAGNOSIS — I110 Hypertensive heart disease with heart failure: Secondary | ICD-10-CM | POA: Insufficient documentation

## 2024-09-02 DIAGNOSIS — Z59868 Other specified financial insecurity: Secondary | ICD-10-CM | POA: Insufficient documentation

## 2024-09-02 DIAGNOSIS — Z7984 Long term (current) use of oral hypoglycemic drugs: Secondary | ICD-10-CM | POA: Insufficient documentation

## 2024-09-02 DIAGNOSIS — Z5971 Insufficient health insurance coverage: Secondary | ICD-10-CM | POA: Insufficient documentation

## 2024-09-02 DIAGNOSIS — I5022 Chronic systolic (congestive) heart failure: Secondary | ICD-10-CM

## 2024-09-02 DIAGNOSIS — Z6841 Body Mass Index (BMI) 40.0 and over, adult: Secondary | ICD-10-CM | POA: Insufficient documentation

## 2024-09-02 DIAGNOSIS — I5020 Unspecified systolic (congestive) heart failure: Secondary | ICD-10-CM | POA: Insufficient documentation

## 2024-09-02 DIAGNOSIS — Z8249 Family history of ischemic heart disease and other diseases of the circulatory system: Secondary | ICD-10-CM | POA: Insufficient documentation

## 2024-09-02 DIAGNOSIS — Z87891 Personal history of nicotine dependence: Secondary | ICD-10-CM | POA: Insufficient documentation

## 2024-09-02 DIAGNOSIS — Z79899 Other long term (current) drug therapy: Secondary | ICD-10-CM | POA: Insufficient documentation

## 2024-09-02 DIAGNOSIS — G4733 Obstructive sleep apnea (adult) (pediatric): Secondary | ICD-10-CM | POA: Insufficient documentation

## 2024-09-02 LAB — BASIC METABOLIC PANEL WITH GFR
Anion gap: 13 (ref 5–15)
BUN: 19 mg/dL (ref 6–20)
CO2: 25 mmol/L (ref 22–32)
Calcium: 9.6 mg/dL (ref 8.9–10.3)
Chloride: 102 mmol/L (ref 98–111)
Creatinine, Ser: 1.3 mg/dL — ABNORMAL HIGH (ref 0.61–1.24)
GFR, Estimated: >60 mL/min
Glucose, Bld: 136 mg/dL — ABNORMAL HIGH (ref 70–99)
Potassium: 3.6 mmol/L (ref 3.5–5.1)
Sodium: 140 mmol/L (ref 135–145)

## 2024-09-02 NOTE — Progress Notes (Signed)
 "   HEART IMPACT TRANSITIONS OF CARE    PCP: Pcp, No  Primary Cardiologist: Darryle ONEIDA Decent, MD   Chief Complaint: HFrEF  HPI:  Dale Evans is a 33 y.o. male with recently diagnosed systolic heart failure, HTN, morbid obesity and OSA.    Started on amlodipine  during ED visit 2/25. Took until it ran out and never got refills. Did not have a PCP.    Admitted 10/25 with SOB, CP, orthopnea and PND. Echo showed EF 30%, LV with GHK, mild concentric LVH, GIDD, RV normal, LA mod dilated, trivial MR. cMRI 10/25: LVEF 25%, LV severly dilated with mild concentric hypertrophy, GHK, RV mildly dilated with normal systolic function, findings consistent with NiCM, no evidence of infarction, inflammation or infiltrative disease. Started on GDMT. Given 1 dose of IV lasix  with appropriate response.   Presented to the ED 11/9.25 with viral gastroenteritis.   [facility] f/u 11/14, coreg  up titrated.   Seen in [facility] clinic for initial visit 08/04/24. Was doing well. NYHA Class II. GDMT further titrated.   He presents back today for further assessment. Feeling better. Overall has lost ~30 lb since HF diagnosis. Denies resting dyspnea. NYHA Class I-II. Compliant w/ meds. BP well controlled, 118/78. He does report some occasional positional dizziness from sitting to standing. No syncope/near syncope.   He has registered for insurance. Coverage starts 1/26.    Cardiac family history: HTN in mom, dad and grandparents.    ROS: All systems negative except as listed in HPI, PMH and Problem List.  SH:  Social History   Socioeconomic History   Marital status: Married    Spouse name: Gaffer   Number of children: 3   Years of education: Not on file   Highest education level: High school graduate  Occupational History   Occupation: [employer]  Tobacco Use   Smoking status: Former    Types: Cigars   Smokeless tobacco: Never  Vaping Use   Vaping status: Never Used  Substance and Sexual Activity   Alcohol  use: No   Drug use: Never    Comment: former user of CBD last used 12/11/19   Sexual activity: Not on file  Other Topics Concern   Not on file  Social History Narrative   ** Merged History Encounter **       Social Drivers of Health   Tobacco Use: Medium Risk (09/02/2024)   Patient History    Smoking Tobacco Use: Former    Smokeless Tobacco Use: Never    Passive Exposure: Not on file  Financial Resource Strain: Medium Risk (07/30/2024)   Overall Financial Resource Strain (CARDIA)    Difficulty of Paying Living Expenses: Somewhat hard  Food Insecurity: No Food Insecurity (07/30/2024)   Epic    Worried About Programme Researcher, Broadcasting/film/video in the Last Year: Never true    Ran Out of Food in the Last Year: Never true  Transportation Needs: No Transportation Needs (07/30/2024)   Epic    Lack of Transportation (Medical): No    Lack of Transportation (Non-Medical): No  Physical Activity: Not on file  Stress: Not on file  Social Connections: Not on file  Intimate Partner Violence: Not At Risk (07/07/2024)   Epic    Fear of Current or Ex-Partner: No    Emotionally Abused: No    Physically Abused: No    Sexually Abused: No  Depression (PHQ2-9): Not on file  Alcohol Screen: Low Risk (07/08/2024)   Alcohol Screen  Last Alcohol Screening Score (AUDIT): 1  Housing: Low Risk (07/30/2024)   Epic    Unable to Pay for Housing in the Last Year: No    Number of Times Moved in the Last Year: 0    Homeless in the Last Year: No  Utilities: Not At Risk (07/30/2024)   Epic    Threatened with loss of utilities: No  Health Literacy: Adequate Health Literacy (07/30/2024)   B1300 Health Literacy    Frequency of need for help with medical instructions: Never    FH:  Family History  Problem Relation Age of Onset   Healthy Mother    Cancer Mother    Hypertension Mother    Healthy Father    Diabetes Father    Hypertension Maternal Grandmother    Stroke Maternal Grandmother     Past Medical  History:  Diagnosis Date   Bronchitis    CHF (congestive heart failure) (HCC)    Hypertension    Sleep apnea    hx of years ago per pt not now   Vertigo     Current Outpatient Medications  Medication Sig Dispense Refill   acetaminophen  (TYLENOL ) 325 MG tablet Take 650 mg by mouth as needed.     atorvastatin  (LIPITOR) 40 MG tablet Take 1 tablet (40 mg total) by mouth daily. 90 tablet 3   carvedilol  (COREG ) 6.25 MG tablet Take 1 tablet (6.25 mg total) by mouth 2 (two) times daily with a meal. 180 tablet 3   empagliflozin  (JARDIANCE ) 10 MG TABS tablet Take 1 tablet (10 mg total) by mouth daily. 90 tablet 3   furosemide  (LASIX ) 20 MG tablet Take 1 tablet (20 mg total) by mouth daily as needed for fluid or edema (for weight gain of 3 pounds overnight or 5 pounds in one week). 30 tablet 3   irbesartan  (AVAPRO ) 150 MG tablet Take 1 tablet (150 mg total) by mouth daily. 90 tablet 3   metFORMIN  (GLUCOPHAGE ) 500 MG tablet Take 1 tablet (500 mg total) by mouth 2 (two) times daily with a meal. 60 tablet 6   spironolactone  (ALDACTONE ) 25 MG tablet Take 1 tablet (25 mg total) by mouth daily. 90 tablet 3   No current facility-administered medications for this encounter.    Vitals:   09/02/24 1337  BP: 118/78  Pulse: 78  SpO2: 96%  Weight: (!) 156.2 kg (344 lb 6.4 oz)     PHYSICAL EXAM: Vitals:   09/02/24 1337  BP: 118/78  Pulse: 78  SpO2: 96%   GENERAL: NAD Lungs- clear CARDIAC:  JVP not elevated         Normal rate with regular rhythm. No MRG. No LEE  ABDOMEN: Soft, non-tender, non-distended.  EXTREMITIES: Warm and well perfused.  NEUROLOGIC: No obvious FND   ECG: none today   ASSESSMENT & PLAN: Recently diagnosed systolic heart failure - Echo 89/74: EF 30%, LV with GHK, mild concentric LVH, GIDD, RV normal, LA mod dilated, trivial MR - cMRI 10/25: LVEF 25%, LV severly dilated with mild concentric hypertrophy, GHK, RV mildly dilated with normal systolic function, findings  consistent with NiCM, no evidence of infarction, inflammation or infiltrative disease.  - Suspect hypertensive CM - NYHA I-II - Euvolemic on exam. Continue Lasix  PRN (rarely has to use)  - Continue Jardiance  10 mg daily - Continue spiro 25 mg daily - Continue coreg  6.25 mg BID.  - Continue irbesartan  150 mg daily. Uninsured (not on Entresto ). Recommend switching to Entresto  once he gets insurance.  -  no up titration of GDMT today given soft BP/positional dizziness  - Echo scheduled in February. If EF remains < 35%, will need referral to EP - check BMP today   HTN - controlled on current regimen - continue per above    Obesity - Body mass index is 43.05 kg/m.  - Will have insurance starting 1/26. Will refer to pharmD for GLP-1.   Probable OSA - Needs sleep study, once insurance is obtained   5. Type 2DM - Hgb A1c 7.7 - continue Jardiance  + Metformin   - on statin therapy, atorva 40 mg     Referred to HFSW (PCP, Medications, Transportation, ETOH Abuse, Drug Abuse, Insurance, Financial ): No  Refer to Pharmacy: Yes Refer to Home Health: No Refer to Advanced Heart Failure Clinic: No  Refer to General Cardiology: Has f/u arranged with Dr. Barbaraann + echo.   Keep f/u w/ Dr. Barbaraann in 2 months   Caffie Shed, PA-C 09/02/2024   "

## 2024-09-02 NOTE — Patient Instructions (Addendum)
 There has been no changes to your medications.  Labs done today, your results will be available in MyChart, we will contact you for abnormal readings.  [facility]. They will call you to arrange your appointment.  Thank you for allowing us  to provider your heart failure care after your recent hospitalization. Please follow-up with Dr. Barbaraann as scheduled.  At [facility], you and your health needs are our priority. As part of our continuing mission to provide you with exceptional heart care, we have created designated Provider Care Teams. These Care Teams include your primary Cardiologist (physician) and Advanced Practice Providers (APPs- Physician Assistants and Nurse Practitioners) who all work together to provide you with the care you need, when you need it.   You may see any of the following providers on your designated Care Team at your next follow up: Dr Toribio Fuel Dr Ezra Shuck Dr. Morene Brownie Greig Mosses, NP Caffie Shed, [facility], [place] Beckey Coe, NP Jordan Lee, NP Ellouise Class, NP Tinnie Redman, PharmD Jaun Bash, PharmD   Please be sure to bring in all your medications bottles to every appointment.    Thank you for choosing [facility]

## 2024-09-03 ENCOUNTER — Other Ambulatory Visit (HOSPITAL_COMMUNITY): Payer: Self-pay

## 2024-10-01 ENCOUNTER — Encounter (HOSPITAL_COMMUNITY): Payer: Self-pay

## 2024-10-01 ENCOUNTER — Other Ambulatory Visit: Payer: Self-pay

## 2024-10-01 ENCOUNTER — Other Ambulatory Visit (HOSPITAL_COMMUNITY): Payer: Self-pay

## 2024-10-03 ENCOUNTER — Other Ambulatory Visit (HOSPITAL_COMMUNITY): Payer: Self-pay

## 2024-10-23 ENCOUNTER — Ambulatory Visit (HOSPITAL_COMMUNITY): Payer: Self-pay

## 2024-10-24 ENCOUNTER — Ambulatory Visit: Payer: Self-pay | Admitting: Pharmacist Clinician (PhC)/ Clinical Pharmacy Specialist

## 2024-10-29 ENCOUNTER — Ambulatory Visit: Payer: Self-pay | Admitting: Cardiovascular Disease
